# Patient Record
Sex: Male | Born: 1980 | Race: White | Marital: Single | Smoking: Never smoker
Health system: Southern US, Community
[De-identification: ages and names within clinical notes are randomized; demographics above are authoritative.]

## PROBLEM LIST (undated history)

## (undated) DIAGNOSIS — E039 Hypothyroidism, unspecified: Secondary | ICD-10-CM

## (undated) DIAGNOSIS — J45909 Unspecified asthma, uncomplicated: Secondary | ICD-10-CM

## (undated) HISTORY — DX: Hypothyroidism, unspecified: E03.9

## (undated) HISTORY — DX: Unspecified asthma, uncomplicated: J45.909

---

## 2009-05-09 ENCOUNTER — Ambulatory Visit: Admission: RE | Admit: 2009-05-09 | Payer: Self-pay | Source: Ambulatory Visit | Admitting: Plastic Surgery

## 2011-03-12 NOTE — Op Note (Unsigned)
Account Number: [id number]      Document ID: [id number]      Admit Date: 05/09/2009      Procedure Date: 05/09/2009            Patient Location: DISCHARGED 05/09/2009      Patient Type: A            SURGEON: Marybelle Killings MD      ASSISTANT:                  PREOPERATIVE DIAGNOSIS:      Excess abdominal fat.            POSTOPERATIVE DIAGNOSES:      Excess abdominal fat.            TITLE OF PROCEDURE:      Liposuction to abdomen to create 6-pack abs, on the left flank 200 mL,      right flank 200 mL, left upper abdomen 300 mL, right upper abdomen 300 mL,      left lower abdomen 200 mL, right lower abdomen 200 mL, etching on the left      150 mL, etching on the right 150 mL, total 1700 mL.            ESTIMATED BLOOD LOSS:      100 mL.            DRAINS:      None.            SPECIMENS:      None.            COMPLICATIONS:      None.            CONDITION:      Stable to recovery room in no apparent distress.            INDICATIONS:      The patient is a 30 year old white male who came to my office requesting      liposuction to create a 6-pack abs.  He was otherwise healthy, with LASIK      eye surgery in the past.  On physical exam, he had good skin tone and good      muscle tone and excess fat of his abdomen and flanks.  I discussed with him      liposuction versus 6-pack abdominal etching.  I showed him before and after      pictures and discussed with him risks and benefits of the operation.  After      all his questions were answered, the patient decided to have liposuction to      his abdomen with 6-pack abdominal etching.  He was cleared by his medical      doctor, consents were signed, photographs were taken, and patient was taken      to the operating room.            DESCRIPTION OF PROCEDURE:      In the operating room, the patient was already marked in the holding area      for liposuction to create a 6-pack abdominal etching.  The area was      injected with tumescent solution after it was prepped and draped in  normal      sterile fashion and the tumescent solution was allowed to work.      Liposuction was carried out to his abdomen and then the flanks and then      etching was performed on the  linea alba of the abdomen on the lateral      border of the rectus abdominis muscles and horizontally along the      inscriptions of the rectus abdominal muscles and the total amount removed      was 1700 mL.  For details of the breakdown, please see the brief OP note.      After the liposuction was completed, the patient had a very nice      symmetrical result with good etching contour.  The incisions were closed      with 3-0 Vicryl sutures.  Steri-Strips were placed.  Dressings were placed.       The patient was awakened, extubated.  Cotton was placed over the areas of      the etching and taped to the skin, then patient was placed in an abdominal      binder and he was awakened, extubated, taken to the recovery room in no      apparent distress.                              _______________________________     Date/Time Signed: _____________      Marybelle Killings MD ([id number])            D:  05/13/2009 17:57 PM by Dr. Marybelle Killings, MD ([id number])      T:  05/14/2009 07:24 AM by [id number]          Everlean Cherry: [id number]) (Doc ID: [id number])                  DG:LOVFIE Patrick Matousek MD

## 2011-07-12 ENCOUNTER — Ambulatory Visit (INDEPENDENT_AMBULATORY_CARE_PROVIDER_SITE_OTHER): Payer: Self-pay | Admitting: Neurology

## 2011-08-23 HISTORY — PX: BACK SURGERY: SHX140

## 2014-10-01 ENCOUNTER — Ambulatory Visit (INDEPENDENT_AMBULATORY_CARE_PROVIDER_SITE_OTHER): Payer: No Typology Code available for payment source | Admitting: Internal Medicine

## 2014-10-01 ENCOUNTER — Encounter (INDEPENDENT_AMBULATORY_CARE_PROVIDER_SITE_OTHER): Payer: Self-pay

## 2014-10-01 VITALS — BP 120/72 | HR 78 | Temp 98.3°F | Resp 18 | Ht 77.0 in | Wt 242.0 lb

## 2014-10-01 DIAGNOSIS — R319 Hematuria, unspecified: Secondary | ICD-10-CM

## 2014-10-01 DIAGNOSIS — N410 Acute prostatitis: Secondary | ICD-10-CM

## 2014-10-01 LAB — UC POCT URINALYSIS DIPSTIX (10) (MULTI-TEST)
Glucose, UA POCT: NEGATIVE mg/dL
Nitrite, UA POCT: NEGATIVE
POCT Leukocytes, UA: NEGATIVE
POCT Spec Gravity, UA: 1.035 (ref 1.001–1.035)
POCT pH, UA: 5 (ref 5–8)
Protein, UA POCT: 100 mg/dL — AB
Urobilinogen, UA: 0.2 mg/dL

## 2014-10-01 MED ORDER — CIPROFLOXACIN HCL 500 MG PO TABS
500.0000 mg | ORAL_TABLET | Freq: Two times a day (BID) | ORAL | Status: AC
Start: 2014-10-01 — End: 2014-10-15

## 2014-10-01 NOTE — Addendum Note (Signed)
Addended by: Carmon Ginsberg. on: 10/01/2014 06:05 PM     Modules accepted: Orders

## 2014-10-01 NOTE — Addendum Note (Signed)
Addended by: Nance Pear on: 10/01/2014 06:14 PM     Modules accepted: Orders

## 2014-10-01 NOTE — Progress Notes (Signed)
Subjective:       Patient ID: Patrick Freeman is a 34 y.o. male.  Chief Complaint   Patient presents with   . Hematuria     x2 days       HPI    The following portions of the patient's history were reviewed and updated as appropriate: allergies, current medications, past family history, past social history, past surgical history and problem list.  Hematuria x 2 days. No pain. No dysuria. No d/c. No abd pain. No back/flank pain. No n/v. No h/o nephrolithiasis. Father with strong h/o nephrolithiasis. No risk of stds. No h/o std in past. H/o UTI once in past.no asa/blood thinners. No new meds. No change in diet. No smoking. No supplements. No vigorous exercise. No weight loss. No f/c/ns. No urin freq/urgency/hesitancy.   Pt states he does occas have difficulty initiating urine with a weak stream - approx once q mo, not aggrevated now  Review of Systems  ROS all other negative except pertinent positives in HPI          Objective:     Physical Exam  BP 120/72 mmHg  Pulse 78  Temp(Src) 98.3 F (36.8 C) (Oral)  Resp 18  Ht 1.956 m (6\' 5" )  Wt 109.77 kg (242 lb)  BMI 28.69 kg/m2  SpO2 97%  Gen: WDWN. NAD.   HEENT: NCAT.                 No cervical adenopathy                No nuchal rigidity  CV: RRR. No m/r/g; 2+ dp  Pulm: CTAB. No w/r/r. Nl respiratory effort  Abd: soft NTND. No HSM. No CVAT  Rectal: +tender boggy prostate  Neuro: A&O  M/S: Nl ROM. No c/c/e in any extremities  Skin: warm; no rash; no pallor  Psych: mood/affect nl. Behavior nl. Judgment nl        Assessment:     hematuria  Results     Procedure Component Value Units Date/Time    UA Dipstix (10)(Multi-Test) [[id number]]  (Abnormal) Collected:  10/01/14 1703     POCT Spec Gravity, UA 1.035 Updated:  10/01/14 1714     POCT pH, UA 5.0      Glucose, UA POCT Negative mg/dL      Protein, UA POCT =409 (A) mg/dL      Ketones, UA POCT Trace (A) mg/dL      Blood, UA POCT Large (A)      POCT Leukocytes, UA Negative      Nitrite, UA POCT Negative      Bilirubin,  UA POCT Small (A)      Urobilinogen, UA 0.2 mg/dL               Plan:       Hematuria with +tender boggy prostate - will treat for prostatitis with cipro  Check cmp, cbc, complete u/a, ucx, u gc/ct/trich, psa  Referral to urology

## 2014-10-01 NOTE — Patient Instructions (Signed)
Bladder Infection,Male (Adult)    Bacterial Prostatitis  The prostate gland is part of the male reproductive system. The gland sits just below the bladder. It surrounds the urethra, the tube that carries urine and semen out of the body. Bacterial prostatitis is an infection of the prostate. It causes the prostate to become painful and swollen. The problem often comes on suddenly, and can make you very sick. But it can be treated.     Causes and symptoms  Bacterial prostatitis is due to infection with bacteria (germs). This infection makes the prostate swell. This squeezes the urethra, narrowing or blocking it. Symptoms may be severe. They can include:   Fever and chills   Low back pain   Having to urinate often   Pain with urination   A less forceful urine stream   Need to strain to urinate   Inability tourinate  Treatment  The infection is treated with antibiotic medications. Take all of the medication until it is gone, even if you start to feel better. If you don't, the infection may come back and be harder to treat. Your health care provider may also suggest other care, such as resting and drinking more fluid. Closely follow any instructions you are given.     [address] [employer] [address] [place], [place] [address]. All rights reserved. This information is not intended as a substitute for professional medical care. Always follow your healthcare professional's instructions.        You have a bladder infection.  Bacteria don't usually stay in the urine. But when they do, the urine can become infected. This is called a urinary tract infection (UTI). An infection can occur anywhere in the urinary tract. It could be in a kidney or in the bladder and urethra. The urethra is the tube that drains the urine from the bladder through the tip of the penis.  The most common place for a UTI is in the bladder. This is called a bladder infection. Most bladder infections are easily treated. They  are not serious unless the infection spreads up to the kidney.  The terms bladder infection, UTI, and cystitis are often used to describe the same thing, but they aren't always the same. Cystitis is an inflammation of the bladder. The most common cause of cystitis is an infection.  Keep in mind:   Infections in the urine are called UTIs   Cystitis is usually caused by a UTI   Not all UTIs and cases of cystitis are bladder infections   Bladder infections are the most common type of cystitis  Bladder infections are treated with antibiotics. They usually clear up quickly without complications. Treatment helps prevent a more serious kidney infection.  Symptoms of a bladder infection  The infection causes inflammation in the urethra and bladder. This inflammation causes many of the symptoms. The most common symptoms of a bladder infection are:   Pain or burning when urinating   Having to go more often than usual   Feeling like you need to goright away   Only a small amount comes out   Blood in urine   Discomfort in your belly (abdomen). This is usually in the lower abdomen, above the pubic bone.   Cloudy, strong, or bad smelling urine   Unable to urinate (retention)   Urinary incontinence   Fever   Loss of appetite  Older adults may also feel confused.  Causes of a bladder infection  Bladder infections are  not contagious. You can't get one from someone else, from a toilet seat, or from sharing a bath.  The most common cause of bladder infections is bacteria from the bowels. The bacteria get onto the skin around the opening of the urethra. From there they can get into the urine and travel up to the bladder. This causes inflammation and an infection. This usually happens because of:   An enlarged prostate   Poor cleaning of the genitals   Procedures that put a tube in your bladder, like a Foley catheter   Bowel incontinence   Older age   Not emptying your bladder. The urine stays there, giving the  bacteria a chance to grow.   Dehydration. This allows urine to stay in the bladder longer.   Constipation. This can cause the bowels to push on the bladder or urethra and keep the bladder from emptying.  Home care  Follow these guidelines when caring for yourself at home:   You have been prescribed antibiotics. Take this medicine until you have finished it, even if you feel better. Taking all of the medicine will make sure the infection has cleared.   You can use acetaminophen or ibuprofen for pain,fever, or discomfort, unless another medicine was prescribed. You can also alternate them, or use both together. They work differently and are a different class of medicines, so taking them together is not an overdose.If you have chronic liver or kidney disease, talk with your health care provider before using these medicines. Also talk with your provider if you've had a stomach ulcer or GI bleeding or are taking blood thinner medications.   You may have been given phenazopyridine to ease burning when you urinate. It will cause your urine to be bright orange. It can stain clothing.  General care:   Drink plenty of fluids, unless your health care provider told you not to. Fluids will prevent dehydration and flush out your bladder.   Use good personal hygiene. Wipe from front to back after using the toilet, and clean your penis regularly. If you aren't circumcised, retract the foreskin when cleaning.   Urinate more frequently, and don't try to hold it in for long periods of time, if possible.   Wear loose-fitting clothes and cotton underwear. Avoid tight-fitting pants. This helps keep you clean and dry.   Change your diet to prevent constipation. This means eating more fresh foods and more fiber, and less junk and fatty foods.   Avoid sex until your symptoms are gone.   Avoid caffeine, alcohol, and spicy foods. These can irritate the bladder.  Follow-up care  Follow up with your health care provider, or as  advised if all symptoms have not cleared up within 5 days. It is important to keep your follow-up appointment. You can talk with your doctor to see if you need more tests of the urinary tract.This is especially important if you have infections that keep coming back.  If a culture was done, you will be told if your treatment needs to be changed. If directed, you can callto find out the results.  If X-rays were taken, you will be told if the results will affect your treatment.  Call 911  Call 911 if any of these occur:   Trouble breathing   Difficulty waking up   Feeling confused   Fainting or loss of consciousness   Rapid heart rate  When to seek medical advice  Call your health care provider right awayif any of these   occur:   Fever of 100.54F (38C) or higher, or as directed by your health care provider   Your symptoms don't get better after 2 days of treatment   Back or abdominal pain that gets worse   Repeated vomiting, or you aren't able to keep medicine down   Weakness or dizziness   2000-2015 [employer] [address] [place] [address]. All rights reserved. This information is not intended as a substitute for professional medical care. Always follow your healthcare professional's instructions.

## 2014-10-02 LAB — CBC AND DIFFERENTIAL
Baso(Absolute): 0 10*3/uL (ref 0.0–0.2)
Basos: 0 %
Eos: 2 %
Eosinophils Absolute: 0.2 10*3/uL (ref 0.0–0.4)
Hematocrit: 44.2 % (ref 37.5–51.0)
Hemoglobin: 15 g/dL (ref 12.6–17.7)
Immature Granulocytes Absolute: 0 10*3/uL (ref 0.0–0.1)
Immature Granulocytes: 0 %
Lymphocytes Absolute: 2.9 10*3/uL (ref 0.7–3.1)
Lymphocytes: 35 %
MCH: 29 pg (ref 26.6–33.0)
MCHC: 33.9 g/dL (ref 31.5–35.7)
MCV: 85 fL (ref 79–97)
Monocytes Absolute: 0.9 10*3/uL (ref 0.1–0.9)
Monocytes: 10 %
Neutrophils Absolute: 4.4 10*3/uL (ref 1.4–7.0)
Neutrophils: 53 %
Platelets: 232 10*3/uL (ref 150–379)
RBC: 5.18 x10E6/uL (ref 4.14–5.80)
RDW: 13.7 % (ref 12.3–15.4)
WBC: 8.4 10*3/uL (ref 3.4–10.8)

## 2014-10-02 LAB — REFLEX - MICROSCOPIC EXAMINATION
Casts, UA: NONE SEEN /lpf
RBC, UA: >30 /hpf — AB (ref 0–?)

## 2014-10-02 LAB — URINALYSIS
Bilirubin, UA: NEGATIVE
Glucose, UA: NEGATIVE
Ketones UA: NEGATIVE
Leukocyte Esterase, UA: NEGATIVE
Nitrite, UA: NEGATIVE
Urine Specific Gravity: 1.029 (ref 1.005–1.030)
Urobilinogen, Ur: 0.2 mg/dL (ref 0.2–1.0)
pH, UA: 6 (ref 5.0–7.5)

## 2014-10-02 LAB — COMPREHENSIVE METABOLIC PANEL
ALT: 11 IU/L (ref 0–44)
AST (SGOT): 19 IU/L (ref 0–40)
Albumin/Globulin Ratio: 1.2 (ref 1.1–2.5)
Albumin: 4.3 g/dL (ref 3.5–5.5)
Alkaline Phosphatase: 69 IU/L (ref 39–117)
BUN / Creatinine Ratio: 14 (ref 8–19)
BUN: 15 mg/dL (ref 6–20)
Bilirubin, Total: <0.2 mg/dL (ref 0.0–1.2)
CO2: 25 mmol/L (ref 18–29)
Calcium: 9.6 mg/dL (ref 8.7–10.2)
Chloride: 102 mmol/L (ref 97–108)
Creatinine: 1.11 mg/dL (ref 0.76–1.27)
EGFR: 100 mL/min/{1.73_m2} (ref >59)
EGFR: 87 mL/min/{1.73_m2} (ref >59)
Globulin, Total: 3.7 g/dL (ref 1.5–4.5)
Glucose: 91 mg/dL (ref 65–99)
Potassium: 4.6 mmol/L (ref 3.5–5.2)
Protein, Total: 8 g/dL (ref 6.0–8.5)
Sodium: 143 mmol/L (ref 134–144)

## 2014-10-02 LAB — PSA: Prostate Specific Antigen, Total: 0.6 ng/mL (ref 0.0–4.0)

## 2014-10-02 NOTE — Progress Notes (Signed)
Quick Note:    Cbc, cmp, psa nl - ua showed blood and calcium oxalate crystals - f/u with pcp for this  ______

## 2014-10-03 ENCOUNTER — Telehealth (INDEPENDENT_AMBULATORY_CARE_PROVIDER_SITE_OTHER): Payer: Self-pay

## 2014-10-03 LAB — URINE CULTURE: Result 1:: NO GROWTH

## 2014-10-03 LAB — CHLAMYDIA/GC NAA, CONFIRMATION
CHLAMYDIA TRACHOMATIS, NAA: NEGATIVE
Neisseria gonorrhoeae, NAA: NEGATIVE

## 2014-10-03 NOTE — Progress Notes (Signed)
Quick Note:    Urine culture showed normal florae.  ______

## 2014-10-03 NOTE — Progress Notes (Signed)
Quick Note:    G/c negative.  ______

## 2014-10-04 ENCOUNTER — Telehealth (INDEPENDENT_AMBULATORY_CARE_PROVIDER_SITE_OTHER): Payer: Self-pay

## 2014-10-04 NOTE — Telephone Encounter (Signed)
-----   Message from Katherine Basset, MD sent at 10/02/2014  2:15 PM EDT -----  Cbc, cmp, psa nl - ua showed blood and calcium oxalate crystals - f/u with pcp for this

## 2014-10-08 ENCOUNTER — Encounter (INDEPENDENT_AMBULATORY_CARE_PROVIDER_SITE_OTHER): Payer: Self-pay

## 2014-10-08 NOTE — Progress Notes (Signed)
Received Authorization for Disclosure of Protected health Information from [facility] [place]. Per request, faxed lab results to [phone]. Authorization attached to chart.

## 2016-03-10 DIAGNOSIS — N200 Calculus of kidney: Secondary | ICD-10-CM | POA: Insufficient documentation

## 2016-03-10 DIAGNOSIS — J45909 Unspecified asthma, uncomplicated: Secondary | ICD-10-CM | POA: Insufficient documentation

## 2016-03-10 DIAGNOSIS — E039 Hypothyroidism, unspecified: Secondary | ICD-10-CM | POA: Insufficient documentation

## 2016-03-10 DIAGNOSIS — B001 Herpesviral vesicular dermatitis: Secondary | ICD-10-CM | POA: Insufficient documentation

## 2016-03-10 DIAGNOSIS — Z206 Contact with and (suspected) exposure to human immunodeficiency virus [HIV]: Secondary | ICD-10-CM | POA: Insufficient documentation

## 2017-01-18 ENCOUNTER — Emergency Department: Payer: BLUE CROSS/BLUE SHIELD

## 2017-01-18 ENCOUNTER — Emergency Department
Admission: EM | Admit: 2017-01-18 | Discharge: 2017-01-18 | Disposition: A | Discharge status: Home / Self Care | Payer: BLUE CROSS/BLUE SHIELD | Attending: Emergency Medicine | Admitting: Emergency Medicine

## 2017-01-18 ENCOUNTER — Telehealth: Payer: Self-pay | Admitting: Emergency Medicine

## 2017-01-18 DIAGNOSIS — R1084 Generalized abdominal pain: Secondary | ICD-10-CM | POA: Insufficient documentation

## 2017-01-18 DIAGNOSIS — R197 Diarrhea, unspecified: Secondary | ICD-10-CM | POA: Insufficient documentation

## 2017-01-18 DIAGNOSIS — R112 Nausea with vomiting, unspecified: Secondary | ICD-10-CM | POA: Diagnosis not present

## 2017-01-18 HISTORY — DX: Unspecified asthma, uncomplicated: J45.909

## 2017-01-18 LAB — COMPREHENSIVE METABOLIC PANEL
ALBUMIN: 4.4 g/dL (ref 3.5–5.0)
ALT: 18 U/L (ref 17–63)
ANION GAP: 8 (ref 5–15)
AST: 26 U/L (ref 15–41)
Alkaline Phosphatase: 56 U/L (ref 38–126)
BILIRUBIN TOTAL: 0.8 mg/dL (ref 0.3–1.2)
BUN: 11 mg/dL (ref 6–20)
CO2: 29 mmol/L (ref 22–32)
Calcium: 9.7 mg/dL (ref 8.9–10.3)
Chloride: 103 mmol/L (ref 101–111)
Creatinine, Ser: 1.09 mg/dL (ref 0.61–1.24)
GFR calc Af Amer: >60 mL/min (ref >60)
GFR calc non Af Amer: >60 mL/min (ref >60)
GLUCOSE: 104 mg/dL — AB (ref 65–99)
POTASSIUM: 4.1 mmol/L (ref 3.5–5.1)
SODIUM: 140 mmol/L (ref 135–145)
TOTAL PROTEIN: 8.2 g/dL — AB (ref 6.5–8.1)

## 2017-01-18 LAB — URINALYSIS, COMPLETE (UACMP) WITH MICROSCOPIC
Bacteria, UA: NONE SEEN
Bilirubin Urine: NEGATIVE
GLUCOSE, UA: NEGATIVE mg/dL
Ketones, ur: NEGATIVE mg/dL
Leukocytes, UA: NEGATIVE
NITRITE: NEGATIVE
PROTEIN: NEGATIVE mg/dL
SPECIFIC GRAVITY, URINE: 1.005 (ref 1.005–1.030)
pH: 6 (ref 5.0–8.0)

## 2017-01-18 LAB — CBC
HCT: 44.5 % (ref 40.0–52.0)
Hemoglobin: 15 g/dL (ref 13.0–18.0)
MCH: 29.6 pg (ref 26.0–34.0)
MCHC: 33.8 g/dL (ref 32.0–36.0)
MCV: 87.8 fL (ref 80.0–100.0)
PLATELETS: 234 10*3/uL (ref 150–440)
RBC: 5.07 MIL/uL (ref 4.40–5.90)
RDW: 13.4 % (ref 11.5–14.5)
WBC: 15.8 10*3/uL — ABNORMAL HIGH (ref 3.8–10.6)

## 2017-01-18 LAB — TROPONIN I: Troponin I: <0.03 ng/mL (ref <0.03)

## 2017-01-18 LAB — LIPASE, BLOOD: Lipase: 21 U/L (ref 11–51)

## 2017-01-18 MED ORDER — ONDANSETRON HCL 4 MG/2ML IJ SOLN
4.0000 mg | Freq: Once | INTRAMUSCULAR | Status: AC
  Administered 2017-01-18: 4 mg via INTRAVENOUS
  Filled 2017-01-18: qty 2

## 2017-01-18 MED ORDER — ONDANSETRON 4 MG PO TBDP: 4.0000 mg | ORAL_TABLET | Freq: Three times a day (TID) | ORAL | 0 refills | Status: DC | PRN

## 2017-01-18 MED ORDER — IOPAMIDOL (ISOVUE-300) INJECTION 61%
100.0000 mL | Freq: Once | INTRAVENOUS | Status: AC | PRN
  Administered 2017-01-18: 100 mL via INTRAVENOUS

## 2017-01-18 MED ORDER — SODIUM CHLORIDE 0.9 % IV BOLUS (SEPSIS)
1000.0000 mL | Freq: Once | INTRAVENOUS | Status: AC
  Administered 2017-01-18: 1000 mL via INTRAVENOUS

## 2017-01-18 MED ORDER — IOPAMIDOL (ISOVUE-300) INJECTION 61%
30.0000 mL | Freq: Once | INTRAVENOUS | Status: AC | PRN
  Administered 2017-01-18: 30 mL via ORAL

## 2017-01-18 NOTE — Discharge Instructions (Signed)
You may take Zofran needed for nausea. Eat a BRAT diet 3 days, then slowly advance diet as tolerated. You will be notified of any abnormal CT or urinalysis results. Return to the ER for worsening symptoms, persistent vomiting, difficulty breathing or other concerns.

## 2017-01-18 NOTE — Telephone Encounter (Signed)
Called patient at request of dr sung to discuss how to get ultrasound done out patient to check for cholecystitis.  Ultrasound was recommended by the radiologist in the CT scan results.  I explained to patient that emergency room doctors cannot order out patient procedures and that he should call his pcp and ask him to order this.   Patient agrees.

## 2017-01-18 NOTE — ED Notes (Signed)
CT called and pt up to toilet

## 2017-01-18 NOTE — ED Notes (Signed)
Patient transported to CT 

## 2017-01-18 NOTE — ED Provider Notes (Signed)
[facility] Emergency Department Provider Note   ____________________________________________   First MD Initiated Contact with Patient 01/18/17 [phone]     (approximate)  I have reviewed the triage vital signs and the nursing notes.   HISTORY  Chief Complaint Abdominal Pain    HPI Alexander Schwartz is a 36 y.o. male who presents to the ED from home with a chief complain of abdominal pain, nausea, vomiting, diarrhea.patient reports he returned from [place] 3 days ago, felt constipated and gave himself an enema. He had1 episode of vomiting that evening.He had good results and felt better the next day.yesterday, patient had recurrence of vomiting with numerous episodes of loose stool. Describes sharp and cramping generalized abdominal pain, maximally at epigastrium which is nonradiating ad intermittent. Denies associated fever, chills, chest pain, shortness of breath, dysuria, testicular pain or swelling. Denies recent trauma. Nothing makes his symptoms better or worse.   Past medical history None  There are no active problems to display for this patient.   No past surgical history on file.  Prior to Admission medications   Medication Sig Start Date End Date Taking? Authorizing Provider  ondansetron (ZOFRAN ODT) 4 MG disintegrating tablet Take 1 tablet (4 mg total) by mouth every 8 (eight) hours as needed for nausea or vomiting. 01/18/17   Irean Hong, MD    Allergies Patient has no known allergies.  No family history on file.  Social History Social History  Substance Use Topics  . Smoking status: Not on file  . Smokeless tobacco: Not on file  . Alcohol use Not on file    Review of Systems  Constitutional: No fever/chills. Eyes: No visual changes. ENT: No sore throat. Cardiovascular: Denies chest pain. Respiratory: Denies shortness of breath. Gastrointestinal: positive for abdominal pain, nausea, vomiting, diarrhea,  constipation. Genitourinary: Negative for dysuria. Musculoskeletal: Negative for back pain. Skin: Negative for rash. Neurological: Negative for headaches, focal weakness or numbness.   ____________________________________________   PHYSICAL EXAM:  VITAL SIGNS: ED Triage Vitals  Enc Vitals Group     BP 01/18/17 0101 (!) 136/115     Pulse Rate 01/18/17 0101 (!) 51     Resp 01/18/17 0101 20     Temp 01/18/17 0101 97.9 F (36.6 C)     Temp Source 01/18/17 0101 Oral     SpO2 01/18/17 0101 100 %     Weight 01/18/17 0101 243 lb (110.2 kg)     Height 01/18/17 0101 6\' 4"  (1.93 m)     Head Circumference --      Peak Flow --      Pain Score 01/18/17 0100 8     Pain Loc --      Pain Edu? --      Excl. in GC? --     Constitutional: Alert and oriented. Well appearing and in no acute distress. Eyes: Conjunctivae are normal. PERRL. EOMI. Head: Atraumatic. Nose: No congestion/rhinnorhea. Mouth/Throat: Mucous membranes are mildly dry.  Oropharynx non-erythematous. Neck: No stridor.   Cardiovascular: Normal rate, regular rhythm. Grossly normal heart sounds.  Good peripheral circulation. Respiratory: Normal respiratory effort.  No retractions. Lungs CTAB. Gastrointestinal: Soft and minimally tender to palpation diffusely without rebound or guarding. No distention. No abdominal bruits. No CVA tenderness. Musculoskeletal: No lower extremity tenderness nor edema.  No joint effusions. Neurologic:  Normal speech and language. No gross focal neurologic deficits are appreciated. No gait instability. Skin:  Skin is warm, dry and intact. No rash noted. Psychiatric:  Mood and affect are normal. Speech and behavior are normal.  ____________________________________________   LABS (all labs ordered are listed, but only abnormal results are displayed)  Labs Reviewed  CBC - Abnormal; Notable for the following:       Result Value   WBC 15.8 (*)    All other components within normal limits   COMPREHENSIVE METABOLIC PANEL - Abnormal; Notable for the following:    Glucose, Bld 104 (*)    Total Protein 8.2 (*)    All other components within normal limits  URINALYSIS, COMPLETE (UACMP) WITH MICROSCOPIC - Abnormal; Notable for the following:    Color, Urine STRAW (*)    APPearance CLEAR (*)    Hgb urine dipstick SMALL (*)    Squamous Epithelial / LPF 0-5 (*)    All other components within normal limits  TROPONIN I  LIPASE, BLOOD   ____________________________________________  EKG  ED ECG REPORT I, Gaige Sebo J, the attending physician, personally viewed and interpreted this ECG.   Date: 01/18/2017  EKG Time: 0101  Rate: 48  Rhythm: junctional  Axis: normal  Intervals:none  ST&T Change: nonspecific  ____________________________________________  RADIOLOGY  Ct Abdomen Pelvis W Contrast  Result Date: 01/18/2017 CLINICAL DATA:  Generalized abdominal pain with nausea and vomiting for 3 days. EXAM: CT ABDOMEN AND PELVIS WITH CONTRAST TECHNIQUE: Multidetector CT imaging of the abdomen and pelvis was performed using the standard protocol following bolus administration of intravenous contrast. CONTRAST:  ISOVUE-300 IOPAMIDOL (ISOVUE-300) INJECTION 61% COMPARISON:  None. FINDINGS: Lower chest: Atelectatic appearing lung base opacities bilaterally, without confluent consolidation. No pleural effusion. Hepatobiliary: No focal liver lesions. No bile duct dilatation. There is mild gallbladder mural thickening and pericholecystic fluid, raising the question of cholecystitis. Pancreas: Unremarkable. No pancreatic ductal dilatation or surrounding inflammatory changes. Spleen: Normal in size without focal abnormality. Adrenals/Urinary Tract: Both adrenals are normal. 1.7 cm upper pole left renal cyst. No significant renal parenchymal lesions. Collecting systems, ureters and urinary bladder are unremarkable. Stomach/Bowel: Stomach is within normal limits. Appendix is normal. No evidence  of bowel wall thickening, distention, or inflammatory changes. Vascular/Lymphatic: No significant vascular findings are present. No enlarged abdominal or pelvic lymph nodes. Reproductive: Unremarkable Other: No ascites. Musculoskeletal: No significant skeletal lesion. IMPRESSION: Mild gallbladder mural thickening and pericholecystic fluid ; cannot exclude cholecystitis. Consider sonography. Electronically Signed   By: Ellery Plunk M.D.   On: 01/18/2017 05:59    ____________________________________________   PROCEDURES  Procedure(s) performed: None  Procedures  Critical Care performed: No  ____________________________________________   INITIAL IMPRESSION / ASSESSMENT AND PLAN / ED COURSE  Pertinent labs & imaging results that were available during my care of the patient were reviewed by me and considered in my medical decision making (see chart for details).  36 year old male who presents with generalized abdominal pain, nausea/vomiting/diarrhea after giving himself an enema. States he is here specifically for a CT scan. Laboratory results remarkable for moderate leukocytosis. I think CT imaging is reasonable given patient's clinical symptoms. Will initiate IV fluid resuscitation and proceed with CT abdomen/pelvis.  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 Was able to convince him to stay for completion of his CT scan. Will notify him of any abnormal CT or urinalysis results. Strict return precautions given. Patient verbalizes understanding and agrees with plan of care.  [JS]  [id number] Patient called back for results. Updated him of urinalysis and  CT results recommending abdominal ultrasound. Patient prefers to do this on an outpatient basis. Nurse to speak with ultrasound department to see how we can schedule this for the patient.  [JS]  [id number] Scheduling department does not open until 7:30 AM. Will ask our nurse  navigator to facilitate scheduling patient's outpatient abdominal ultrasound.  [JS]    Clinical Course User Index [JS] Irean Hong, MD     ____________________________________________   FINAL CLINICAL IMPRESSION(S) / ED DIAGNOSES  Final diagnoses:  Generalized abdominal pain  Nausea vomiting and diarrhea      NEW MEDICATIONS STARTED DURING THIS VISIT:  Discharge Medication List as of 01/18/2017  5:39 AM    START taking these medications   Details  ondansetron (ZOFRAN ODT) 4 MG disintegrating tablet Take 1 tablet (4 mg total) by mouth every 8 (eight) hours as needed for nausea or vomiting., Starting Tue 01/18/2017, Print         Note:  This document was prepared using Dragon voice recognition software and may include unintentional dictation errors.    Irean Hong, MD 01/18/17 [phone]

## 2017-01-21 ENCOUNTER — Other Ambulatory Visit: Payer: Self-pay | Admitting: Internal Medicine

## 2017-01-21 ENCOUNTER — Ambulatory Visit
Admission: RE | Admit: 2017-01-21 | Discharge: 2017-01-21 | Disposition: A | Discharge status: Home / Self Care | Payer: BLUE CROSS/BLUE SHIELD | Source: Ambulatory Visit | Attending: Internal Medicine | Admitting: Internal Medicine

## 2017-01-21 DIAGNOSIS — R1013 Epigastric pain: Secondary | ICD-10-CM | POA: Diagnosis not present

## 2017-01-21 DIAGNOSIS — N281 Cyst of kidney, acquired: Secondary | ICD-10-CM | POA: Insufficient documentation

## 2017-01-21 DIAGNOSIS — K829 Disease of gallbladder, unspecified: Secondary | ICD-10-CM | POA: Diagnosis not present

## 2017-03-17 ENCOUNTER — Emergency Department
Admission: EM | Admit: 2017-03-17 | Discharge: 2017-03-17 | Disposition: A | Discharge status: Home / Self Care | Payer: BLUE CROSS/BLUE SHIELD | Attending: Emergency Medicine | Admitting: Emergency Medicine

## 2017-03-17 ENCOUNTER — Emergency Department: Payer: BLUE CROSS/BLUE SHIELD

## 2017-03-17 ENCOUNTER — Encounter: Payer: Self-pay | Admitting: Emergency Medicine

## 2017-03-17 DIAGNOSIS — Z79899 Other long term (current) drug therapy: Secondary | ICD-10-CM | POA: Diagnosis not present

## 2017-03-17 DIAGNOSIS — R1013 Epigastric pain: Secondary | ICD-10-CM | POA: Diagnosis present

## 2017-03-17 DIAGNOSIS — R197 Diarrhea, unspecified: Secondary | ICD-10-CM | POA: Insufficient documentation

## 2017-03-17 DIAGNOSIS — R001 Bradycardia, unspecified: Secondary | ICD-10-CM | POA: Diagnosis not present

## 2017-03-17 DIAGNOSIS — R112 Nausea with vomiting, unspecified: Secondary | ICD-10-CM | POA: Diagnosis not present

## 2017-03-17 DIAGNOSIS — J45909 Unspecified asthma, uncomplicated: Secondary | ICD-10-CM | POA: Insufficient documentation

## 2017-03-17 LAB — COMPREHENSIVE METABOLIC PANEL
ALT: 22 U/L (ref 17–63)
AST: 32 U/L (ref 15–41)
Albumin: 4.1 g/dL (ref 3.5–5.0)
Alkaline Phosphatase: 61 U/L (ref 38–126)
Anion gap: 9 (ref 5–15)
BILIRUBIN TOTAL: 0.6 mg/dL (ref 0.3–1.2)
BUN: 14 mg/dL (ref 6–20)
CALCIUM: 9.2 mg/dL (ref 8.9–10.3)
CHLORIDE: 104 mmol/L (ref 101–111)
CO2: 27 mmol/L (ref 22–32)
Creatinine, Ser: 1.16 mg/dL (ref 0.61–1.24)
GLUCOSE: 159 mg/dL — AB (ref 65–99)
Potassium: 3.8 mmol/L (ref 3.5–5.1)
Sodium: 140 mmol/L (ref 135–145)
TOTAL PROTEIN: 8.3 g/dL — AB (ref 6.5–8.1)

## 2017-03-17 LAB — URINALYSIS, COMPLETE (UACMP) WITH MICROSCOPIC
Bacteria, UA: NONE SEEN
Bilirubin Urine: NEGATIVE
Glucose, UA: 50 mg/dL — AB
HGB URINE DIPSTICK: NEGATIVE
Ketones, ur: 5 mg/dL — AB
Leukocytes, UA: NEGATIVE
Nitrite: NEGATIVE
Protein, ur: NEGATIVE mg/dL
SPECIFIC GRAVITY, URINE: 1.033 — AB (ref 1.005–1.030)
SQUAMOUS EPITHELIAL / LPF: NONE SEEN
pH: 6 (ref 5.0–8.0)

## 2017-03-17 LAB — TROPONIN I: Troponin I: <0.03 ng/mL (ref <0.03)

## 2017-03-17 LAB — CBC
HEMATOCRIT: 42.7 % (ref 40.0–52.0)
Hemoglobin: 14.6 g/dL (ref 13.0–18.0)
MCH: 29.9 pg (ref 26.0–34.0)
MCHC: 34.2 g/dL (ref 32.0–36.0)
MCV: 87.3 fL (ref 80.0–100.0)
PLATELETS: 201 10*3/uL (ref 150–440)
RBC: 4.89 MIL/uL (ref 4.40–5.90)
RDW: 13.3 % (ref 11.5–14.5)
WBC: 10.7 10*3/uL — ABNORMAL HIGH (ref 3.8–10.6)

## 2017-03-17 LAB — LIPASE, BLOOD: Lipase: 24 U/L (ref 11–51)

## 2017-03-17 MED ORDER — FENTANYL CITRATE (PF) 100 MCG/2ML IJ SOLN
25.0000 ug | Freq: Once | INTRAMUSCULAR | Status: AC
  Administered 2017-03-17: 25 ug via INTRAVENOUS

## 2017-03-17 MED ORDER — FENTANYL CITRATE (PF) 100 MCG/2ML IJ SOLN
50.0000 ug | Freq: Once | INTRAMUSCULAR | Status: AC
  Administered 2017-03-17: 50 ug via INTRAVENOUS

## 2017-03-17 MED ORDER — ONDANSETRON HCL 4 MG/2ML IJ SOLN
INTRAMUSCULAR | Status: AC
  Filled 2017-03-17: qty 2

## 2017-03-17 MED ORDER — DICYCLOMINE HCL 10 MG PO CAPS
10.0000 mg | ORAL_CAPSULE | Freq: Once | ORAL | Status: AC
  Administered 2017-03-17: 10 mg via ORAL
  Filled 2017-03-17: qty 1

## 2017-03-17 MED ORDER — ATROPINE SULFATE 1 MG/10ML IJ SOSY
PREFILLED_SYRINGE | INTRAMUSCULAR | Status: AC
  Filled 2017-03-17: qty 10

## 2017-03-17 MED ORDER — SODIUM CHLORIDE 0.9 % IV BOLUS (SEPSIS)
1000.0000 mL | Freq: Once | INTRAVENOUS | Status: AC
  Administered 2017-03-17: 1000 mL via INTRAVENOUS

## 2017-03-17 MED ORDER — ONDANSETRON 4 MG PO TBDP: 4.0000 mg | ORAL_TABLET | Freq: Three times a day (TID) | ORAL | 0 refills | Status: DC | PRN

## 2017-03-17 MED ORDER — FENTANYL CITRATE (PF) 100 MCG/2ML IJ SOLN
INTRAMUSCULAR | Status: AC
  Administered 2017-03-17: 25 ug via INTRAVENOUS
  Filled 2017-03-17: qty 2

## 2017-03-17 MED ORDER — ONDANSETRON HCL 4 MG/2ML IJ SOLN
4.0000 mg | Freq: Once | INTRAMUSCULAR | Status: AC
  Administered 2017-03-17: 4 mg via INTRAVENOUS

## 2017-03-17 MED ORDER — DICYCLOMINE HCL 20 MG PO TABS: 20.0000 mg | ORAL_TABLET | Freq: Three times a day (TID) | ORAL | 0 refills | Status: AC | PRN

## 2017-03-17 MED ORDER — IOPAMIDOL (ISOVUE-300) INJECTION 61%
100.0000 mL | Freq: Once | INTRAVENOUS | Status: AC | PRN
  Administered 2017-03-17: 100 mL via INTRAVENOUS

## 2017-03-17 NOTE — ED Provider Notes (Signed)
[facility] Emergency Department Provider Note  ____________________________________________   First MD Initiated Contact with Patient 03/17/17 [phone]     (approximate)  I have reviewed the triage vital signs and the nursing notes.   HISTORY  Chief Complaint Abdominal Pain    HPI Alexander Schwartz is a 36 y.o. male presents with acute onset of epigastric abdominal pain which began at 9 PM last night accompanied by nausea vomiting and diarrhea. Patient denies any fever afebrile on presentation. Patient states last by mouth ingestion was approximately 8:00 yesterday evening. Patient denies any urinary symptoms. Current pain score 9 out of 10.   Past Medical History:  Diagnosis Date  . Asthma     There are no active problems to display for this patient.   History reviewed. No pertinent surgical history.  Prior to Admission medications   Medication Sig Start Date End Date Taking? Authorizing Provider  ondansetron (ZOFRAN ODT) 4 MG disintegrating tablet Take 1 tablet (4 mg total) by mouth every 8 (eight) hours as needed for nausea or vomiting. 01/18/17   Irean Hong, MD    Allergies no known drug allergies No family history on file.  Social History Social History  Substance Use Topics  . Smoking status: Never Smoker  . Smokeless tobacco: Never Used  . Alcohol use No    Review of Systems Constitutional: No fever/chills Eyes: No visual changes. ENT: No sore throat. Cardiovascular: Denies chest pain. Respiratory: Denies shortness of breath. Gastrointestinal: positive for abdominal pain nausea vomiting and diarrhea Genitourinary: Negative for dysuria. Musculoskeletal: Negative for neck pain.  Negative for back pain. Integumentary: Negative for rash. Neurological: Negative for headaches, focal weakness or numbness.  ____________________________________________   PHYSICAL EXAM:  VITAL SIGNS: ED Triage Vitals  Enc Vitals Group     BP  03/17/17 0454 (!) 127/93     Pulse Rate 03/17/17 0454 (!) 38     Resp 03/17/17 0454 18     Temp 03/17/17 0454 98.9 F (37.2 C)     Temp Source 03/17/17 0454 Oral     SpO2 03/17/17 0454 100 %     Weight 03/17/17 0450 108.9 kg (240 lb)     Height 03/17/17 0450 1.956 m (6\' 5" )     Head Circumference --      Peak Flow --      Pain Score 03/17/17 0449 9     Pain Loc --      Pain Edu? --      Excl. in GC? --     Constitutional: Alert and oriented. Well appearing and in no acute distress. Eyes: Conjunctivae are normal.  Head: Atraumatic. Mouth/Throat: Mucous membranes are moist.  Oropharynx non-erythematous. Neck: No stridor.   Cardiovascular: Normal rate, regular rhythm. Good peripheral circulation. Grossly normal heart sounds. Respiratory: Normal respiratory effort.  No retractions. Lungs CTAB. Gastrointestinal: Soft and nontender. No distention.  Musculoskeletal: No lower extremity tenderness nor edema. No gross deformities of extremities. Neurologic:  Normal speech and language. No gross focal neurologic deficits are appreciated.  Skin:  Skin is warm, dry and intact. No rash noted. Psychiatric: Mood and affect are normal. Speech and behavior are normal.  ____________________________________________   LABS (all labs ordered are listed, but only abnormal results are displayed)  Labs Reviewed  CBC - Abnormal; Notable for the following:       Result Value   WBC 10.7 (*)    All other components within normal limits  COMPREHENSIVE METABOLIC PANEL -  Abnormal; Notable for the following:    Glucose, Bld 159 (*)    Total Protein 8.3 (*)    All other components within normal limits  URINALYSIS, COMPLETE (UACMP) WITH MICROSCOPIC - Abnormal; Notable for the following:    Color, Urine YELLOW (*)    APPearance CLEAR (*)    Specific Gravity, Urine 1.033 (*)    Glucose, UA 50 (*)    Ketones, ur 5 (*)    All other components within normal limits  GASTROINTESTINAL PANEL BY PCR, STOOL  (REPLACES STOOL CULTURE)  TROPONIN I  LIPASE, BLOOD   ____________________________________________  EKG  ED ECG REPORT I, Waldron N Brieann Osinski, the attending physician, personally viewed and interpreted this ECG.   Date: 03/17/2017  EKG Time: 4:59 AM  Rate: 40  Rhythm: sinus bradycardia  Axis: normal  Intervals:normal  ST&T Change:   ____________________________________________  RADIOLOGY I, [place] Dewayne Shorter, personally viewed and evaluated these images (plain radiographs) as part of my medical decision making, as well as reviewing the written report by the radiologist.  Ct Abdomen Pelvis W Contrast  Result Date: 03/17/2017 CLINICAL DATA:  Acute onset of mid abdominal pain. Nausea, vomiting and diarrhea. Initial encounter. EXAM: CT ABDOMEN AND PELVIS WITH CONTRAST TECHNIQUE: Multidetector CT imaging of the abdomen and pelvis was performed using the standard protocol following bolus administration of intravenous contrast. CONTRAST:  ISOVUE-300 IOPAMIDOL (ISOVUE-300) INJECTION 61% COMPARISON:  CT of the abdomen and pelvis from 01/18/2017, and abdominal ultrasound performed 01/21/2017 FINDINGS: Lower chest: Mild bibasilar atelectasis is noted. The visualized portions of the mediastinum are unremarkable. Hepatobiliary: The liver is unremarkable in appearance. The gallbladder is unremarkable in appearance. The common bile duct remains normal in caliber. Pancreas: The pancreas is within normal limits. Spleen: The spleen is unremarkable in appearance. Adrenals/Urinary Tract: The adrenal glands are unremarkable in appearance. A small left renal cyst is noted. The kidneys are otherwise unremarkable. There is no evidence of hydronephrosis. No renal or ureteral stones are identified. Stomach/Bowel: The stomach is unremarkable in appearance. The small bowel is within normal limits. The appendix is normal in caliber, without evidence of appendicitis. The colon is unremarkable in appearance.  Vascular/Lymphatic: The abdominal aorta is unremarkable in appearance. The inferior vena cava is grossly unremarkable. No retroperitoneal lymphadenopathy is seen. No pelvic sidewall lymphadenopathy is identified. Reproductive: The bladder is mildly distended and grossly unremarkable. The prostate remains normal in size. Other: Mild nonspecific haziness is noted about the celiac trunk, of uncertain significance. Musculoskeletal: No acute osseous abnormalities are identified. Vacuum phenomenon and endplate sclerotic change are noted at L5-S1. The visualized musculature is unremarkable in appearance. IMPRESSION: 1. Mild nonspecific haziness noted about the celiac trunk, perhaps slightly more prominent than on the prior study, of uncertain significance. 2. Small left renal cyst. 3. Mild bibasilar atelectasis noted. Electronically Signed   By: Roanna Raider M.D.   On: 03/17/2017 05:41     Procedures   ____________________________________________   INITIAL IMPRESSION / ASSESSMENT AND PLAN / ED COURSE  As part of my medical decision making, I reviewed the following data within the electronic MEDICAL RECORD NUMBER rate 40-year-old male presenting with above stated history of abdominal discomfort with nausea and vomiting and diarrhea. patient noted to be markedly bradycardic on arrival with heart rate drifting as low as 38however patient remained normotensive with a systolic blood pressure and 27/93.Marland Kitchen Patient given Fentanyl with improvement of pain. CT scan of the abdomen and pelvis revealed no acute intra-abdominal pathology. I reviewed the patient's  previous chart which revealed visit in August for similar complaint with negative CT scan obtained at that time as well. Patient's heart rate and August was noted to be 51.I spoke with the patient regarding all clinical findings. We'll obtain a stool sample to evaluate for infectious etiology of the patient's vomiting and diarrhea. if the patient becomes symptomatic  bradycardia would admit the patient to the hospital. Patient discussed with Dr Langston MaskerShaevitz in signout.     ____________________________________________  FINAL CLINICAL IMPRESSION(S) / ED DIAGNOSES  Final diagnoses:  Nausea vomiting and diarrhea  Epigastric pain  Bradycardia     MEDICATIONS GIVEN DURING THIS VISIT:  Medications  atropine 1 MG/10ML injection (not administered)  sodium chloride 0.9 % bolus 1,000 mL (0 mLs Intravenous Stopped 03/17/17 0540)  sodium chloride 0.9 % bolus 1,000 mL (0 mLs Intravenous Stopped 03/17/17 0540)  ondansetron (ZOFRAN) injection 4 mg (4 mg Intravenous Given 03/17/17 0510)  fentaNYL (SUBLIMAZE) injection 25 mcg (25 mcg Intravenous Given 03/17/17 0511)  iopamidol (ISOVUE-300) 61 % injection 100 mL (100 mLs Intravenous Contrast Given 03/17/17 0521)  fentaNYL (SUBLIMAZE) injection 25 mcg (25 mcg Intravenous Given 03/17/17 0618)  fentaNYL (SUBLIMAZE) injection 50 mcg (50 mcg Intravenous Given 03/17/17 0622)     NEW OUTPATIENT MEDICATIONS STARTED DURING THIS VISIT:  New Prescriptions   No medications on file    Modified Medications   No medications on file    Discontinued Medications   No medications on file     Note:  This document was prepared using Dragon voice recognition software and may include unintentional dictation errors.    Darci CurrentBrown, Bow[place] N, MD 03/17/17 [phone]

## 2017-03-17 NOTE — ED Triage Notes (Signed)
Patient ambulatory to triage with steady gait, without difficulty or distress noted; pt reports mid abd pain since 9pm accomp by N/V/D

## 2017-03-17 NOTE — ED Notes (Signed)

## 2017-03-17 NOTE — ED Notes (Signed)
Upon taking vs, noted pt with slow regular pulse; pt placed on card monitor for EKG; pt denies any hx of bradycardia; Dr Manson PasseyBrown to bedside

## 2017-03-17 NOTE — ED Provider Notes (Signed)
Signout from Dr. Manson PasseyBrown in this 36 year old male with recurrent nausea vomiting and diarrhea. Pending stool studies. Also patient is bradycardic. We'll continue to monitor. However, patient appeared to be bradycardic during his last ER visit.  Physical Exam  BP 125/79   Pulse (!) 41 Comment: MD aware of bradycardia  Temp 98.9 F (37.2 C) (Oral)   Resp 14   Ht 6\' 5"  (1.956 m)   Wt 108.9 kg (240 lb)   SpO2 100%   BMI 28.46 kg/m  ----------------------------------------- 8:54 AM on 03/17/2017 -----------------------------------------   Physical Exam Patient without any distress at this time. ED Course  Procedures  MDM Patient without further episodes of nausea and vomiting. Patient not reporting any pain at this time. Not reporting any predictable symptoms. Patient not able to move his bowels. He says that he would rather follow up with GI and cardiology as an outpatient. Patient will be discharged at this time.       Myrna BlazerSchaevitz, Larraine Argo Matthew, MD 03/17/17 [phone]

## 2017-04-01 ENCOUNTER — Other Ambulatory Visit: Payer: Self-pay

## 2017-04-01 ENCOUNTER — Ambulatory Visit: Payer: BLUE CROSS/BLUE SHIELD | Admitting: Gastroenterology

## 2017-04-01 ENCOUNTER — Encounter: Payer: Self-pay | Admitting: Gastroenterology

## 2017-04-01 VITALS — BP 107/69 | HR 82 | Temp 98.1°F | Ht 77.0 in | Wt 245.4 lb

## 2017-04-01 DIAGNOSIS — R197 Diarrhea, unspecified: Secondary | ICD-10-CM

## 2017-04-01 DIAGNOSIS — R1013 Epigastric pain: Secondary | ICD-10-CM | POA: Diagnosis not present

## 2017-04-01 MED ORDER — OMEPRAZOLE 40 MG PO CPDR: 40.0000 mg | DELAYED_RELEASE_CAPSULE | Freq: Every day | ORAL | 3 refills | Status: DC

## 2017-04-01 NOTE — Progress Notes (Signed)
Melodie BouillonVarnita Tallulah Hosman, MD [address], [address], [place], [place], [id number] [address], [place], [place], [address] Phone: [phone]  Fax: [phone]  Consultation  Referring Provider:     [facility] Dr. Judithann SheenSparks Primary Care Physician:  [facility] Primary Gastroenterologist:  Pasty SpillersVarnita B Jaydan Meidinger, MD        Reason for Consultation:     Epigastric abdominal pain  Date of Consultation:  04/01/2017         HPI:   Alexander Schwartz is a 36 y.o. male with 2 episodes of midepigastric abdominal pain once in August 2018 and another one October 2018 that led him to the ER.  During the ER visit CT scan did not reveal an etiology of his abdominal pain.  Abdominal ultrasound was also done which showed a mildly thickened gallbladder wall measuring 0.4 cm, no evidence of gallstones or sludge.  CBD was normal at 0.3 cm.  On review of his CT scan images from October 2018 there is a lot of stool that I noted in the colon.  Patient has had no weight loss.  He states once this pain starts it lasts 2-3 weeks.  No melena or hematochezia.  He states both times that the pain started he was traveling.  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  Has never been on a PPI or antireflux medications.  He was given dicyclomine and Zofran in the ER which do not help.  He is on Truvada but states that is only for ?prophylaxis.  His HIV testing in care everywhere under his last annual exam, PCP note was negative.  No family history of colon cancer.  No previous history of endoscopies.  Past Medical History:  Diagnosis Date  . Asthma     History reviewed. No pertinent surgical history.  Prior to Admission medications   Medication Sig Start Date End Date Taking? Authorizing Provider  albuterol (PROAIR HFA) 108 (90 Base) MCG/ACT inhaler  01/27/16  Yes [provider]  emtricitabine-tenofovir (TRUVADA) 200-300 MG tablet Take by  mouth. 03/16/17  Yes [provider]  finasteride (PROSCAR) 5 MG tablet TK 1 T PO  ONCE D 02/22/17  Yes [provider]  fluocinonide cream (LIDEX) 0.05 % once daily as needed. 10/20/13  Yes [provider]  fluticasone (FLONASE) 50 MCG/ACT nasal spray PLACE 2 SPRAYS IN EACH NOSTRIL ONCE DAILY 02/20/17  Yes [provider]  Fluticasone-Salmeterol (ADVAIR DISKUS) 250-50 MCG/DOSE AEPB USE 1 INHALATION BY MOUTH INTO THE LUNGS EVERY 12 HOURS 02/20/17  Yes [provider]  montelukast (SINGULAIR) 10 MG tablet Take by mouth. 03/16/17  Yes [provider]  tretinoin (RETIN-A) 0.1 % cream Apply topically.   Yes [provider]  valACYclovir (VALTREX) 1000 MG tablet TK 1 T PO ONCE D GEF VALTREX 02/07/17  Yes [provider]  Ciclesonide (ZETONNA) 37 MCG/ACT AERS Place into the nose.    [provider]  dicyclomine (BENTYL) 20 MG tablet Take 1 tablet (20 mg total) by mouth 3 (three) times daily as needed for spasms. 03/17/17 03/27/17  Darci CurrentBrown, [place] N, MD  levothyroxine (SYNTHROID, LEVOTHROID) 25 MCG tablet Take by mouth.    [provider]  omeprazole (PRILOSEC) 40 MG capsule Take 1 capsule (40 mg total) daily by mouth. 04/01/17   Pasty Spillersahiliani, Tijah Hane B, MD  ondansetron (ZOFRAN ODT) 4 MG disintegrating tablet Take 1 tablet (4 mg total) by mouth  every 8 (eight) hours as needed for nausea or vomiting. Patient not taking: Reported on 04/01/2017 01/18/17   Irean HongSung, Jade J, MD  ondansetron (ZOFRAN ODT) 4 MG disintegrating tablet Take 1 tablet (4 mg total) by mouth every 8 (eight) hours as needed for nausea or vomiting. Patient not taking: Reported on 04/01/2017 03/17/17   Darci CurrentBrown, [place] N, MD    Family History  Problem Relation Age of Onset  . Breast cancer Mother   . Prostate cancer Father   . Colon cancer Maternal Grandmother      Social History   Tobacco Use  . Smoking status: Never Smoker  . Smokeless tobacco: Never Used    Substance Use Topics  . Alcohol use: No  . Drug use: No    Allergies as of 04/01/2017  . (No Known Allergies)    Review of Systems:    All systems reviewed and negative except where noted in HPI.   Physical Exam:  Vital signs in last 24 hours: @VSRANGES @   General:   Pleasant, cooperative in NAD Head:  Normocephalic and atraumatic. Eyes:   No icterus.   Conjunctiva pink. PERRLA. Ears:  Normal auditory acuity. Neck:  Supple; no masses or thyroidomegaly Lungs: Respirations even and unlabored. Lungs clear to auscultation bilaterally.   No wheezes, crackles, or rhonchi.  Heart:  Regular rate and rhythm;  Without murmur, clicks, rubs or gallops Abdomen:  Soft, nondistended, mildly tender to palpation b/l upper quadrant. Normal bowel sounds. No appreciable masses or hepatomegaly.  No rebound or guarding.  Neurologic:  Alert and oriented x3;  grossly normal neurologically. Skin:  Intact without significant lesions or rashes. Cervical Nodes:  No significant cervical adenopathy. Psych:  Alert and cooperative. Normal affect.  LAB RESULTS: No results for input(s): WBC, HGB, HCT, PLT in the last 72 hours. BMET No results for input(s): NA, K, CL, CO2, GLUCOSE, BUN, CREATININE, CALCIUM in the last 72 hours. LFT No results for input(s): PROT, ALBUMIN, AST, ALT, ALKPHOS, BILITOT, BILIDIR, IBILI in the last 72 hours. PT/INR No results for input(s): LABPROT, INR in the last 72 hours. Labs from October 2018 when he visited the ER reviewed and shows a normal CMP And normal hemoglobin. STUDIES: No results found. CT and abdominal ultrasound report reviewed.  CT images reviewed specifically in relation to his colon which showed a large stool burden.    Impression / Plan:   Alexander Schwartz is a 36 y.o. y/o male with midepigastric abdominal pain x2, once in August and once in October with unrevealing CT scan and abdominal ultrasound with normal liver enzymes and hemoglobin and no alarm  symptoms  Patient reports symptoms of acid reflux and has never been on anti-reflux medications Will order stool for H. pylori given his dyspepsia Pain is not consistent with biliary colic and does not occur in relation to meals. Is worse at night indicating relation to acid reflux. No alarm symptoms at this time to indicate further interventions Patient was asked to provide stool sample before starting Prilosec daily.  Educated on antireflux measures Avoid NSAIDs Patient was asked to start MiraLAX daily and educated on titrating it to goal of 1-2 soft problems every day Patient was asked to contact us if these measures do not improve his symptoms  Thank you for involving me in the care of this patient.     Pasty SpillersVarnita B Ineta Sinning, MD  04/01/2017, 9:13 AM

## 2017-04-01 NOTE — Patient Instructions (Signed)
Please start Prilosec daily once you do the H pylori stool lab.  Please use over the counter Miralax as directed.

## 2017-04-12 LAB — H. PYLORI ANTIGEN, STOOL: H pylori Ag, Stl: NEGATIVE

## 2017-04-18 ENCOUNTER — Telehealth: Payer: Self-pay

## 2017-04-18 NOTE — Telephone Encounter (Signed)
Advised patient of results per Dr. Maximino Greenlandahiliani:  - H. Pylori is negative.

## 2017-07-12 ENCOUNTER — Ambulatory Visit: Payer: Self-pay | Admitting: Medical

## 2017-07-12 ENCOUNTER — Encounter: Payer: Self-pay | Admitting: Medical

## 2017-07-12 VITALS — BP 124/86 | HR 119 | Temp 99.0°F | Resp 18 | Ht 77.0 in | Wt 255.2 lb

## 2017-07-12 DIAGNOSIS — Z114 Encounter for screening for human immunodeficiency virus [HIV]: Secondary | ICD-10-CM

## 2017-07-12 DIAGNOSIS — J029 Acute pharyngitis, unspecified: Secondary | ICD-10-CM

## 2017-07-12 MED ORDER — AMOXICILLIN-POT CLAVULANATE 875-125 MG PO TABS: 1.0000 | ORAL_TABLET | Freq: Two times a day (BID) | ORAL | 0 refills | Status: AC

## 2017-07-12 NOTE — Progress Notes (Signed)
   Subjective:    Patient ID: Alexander BusmanJoseph Clayton Schwartz, male    DOB: [birth year], 37 y.o.   MRN: [id number]  HPI 37 yo in non acute distress. Sore throat since Sunday. Night sweat last night , muscle aches. No runny nose.  Upper and lower back aches. Some in the shoulders nothing in the legs..   Review of Systems  Constitutional: Positive for chills, diaphoresis (last night) and fatigue (little bit).  HENT: Positive for sore throat and trouble swallowing. Negative for congestion, ear discharge and voice change.   Respiratory: Positive for cough (mild). Negative for shortness of breath.   Cardiovascular: Negative for chest pain.  Gastrointestinal: Positive for diarrhea. Negative for nausea and vomiting.  Genitourinary: Negative for dysuria.  Musculoskeletal: Positive for back pain and myalgias.  Skin: Positive for rash (eczema).  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  Hematological: Positive for adenopathy.  Psychiatric/Behavioral: Negative for behavioral problems, self-injury, sleep disturbance and suicidal ideas. The patient is not nervous/anxious.    Slept more on Sunday and on Monday.    Objective:   Physical Exam  Constitutional: He is oriented to person, place, and time. He appears well-developed and well-nourished.  HENT:  Head: Normocephalic and atraumatic.  Right Ear: Hearing, external ear and ear canal normal. A middle ear effusion is present.  Left Ear: Hearing, external ear and ear canal normal. A middle ear effusion is present.  Mouth/Throat: Uvula is midline and mucous membranes are normal. Posterior oropharyngeal edema and posterior oropharyngeal erythema present.  Eyes: Conjunctivae and EOM are normal. Pupils are equal, round, and reactive to light.  Neck: Normal range of motion. Neck supple.  Cardiovascular: Normal rate, regular rhythm and normal heart sounds.  Pulmonary/Chest:  Effort normal and breath sounds normal.  Lymphadenopathy:    He has cervical adenopathy (left side).  Neurological: He is alert and oriented to person, place, and time.  Skin: Skin is warm and dry.  Psychiatric: He has a normal mood and affect. His behavior is normal. Judgment and thought content normal.  Nursing note and vitals reviewed.   1+ tonsil on the right 2+ tonsil on the left.      Assessment & Plan:  Pharyngitis  Possible Left tonsillar abcess will recheck in 24 hours. Patient asks for  HIV test " I just want to be tested". Will also check CBC w/ diff.  Appointment made for  3 pm tomorrow afternoon. Patient verbalizes understanding and has no questions at discharge.

## 2017-07-12 NOTE — Patient Instructions (Signed)
Pharyngitis Pharyngitis is a sore throat (pharynx). There is redness, pain, and swelling of your throat. Follow these instructions at home:  Drink enough fluids to keep your pee (urine) clear or pale yellow.  Only take medicine as told by your doctor. ? You may get sick again if you do not take medicine as told. Finish your medicines, even if you start to feel better. ? Do not take aspirin.  Rest.  Rinse your mouth (gargle) with salt water ( tsp of salt per 1 qt of water) every 1-2 hours. This will help the pain.  If you are not at risk for choking, you can suck on hard candy or sore throat lozenges. Contact a doctor if:  You have large, tender lumps on your neck.  You have a rash.  You cough up Dentinger, yellow-brown, or bloody spit. Get help right away if:  You have a stiff neck.  You drool or cannot swallow liquids.  You throw up (vomit) or are not able to keep medicine or liquids down.  You have very bad pain that does not go away with medicine.  You have problems breathing (not from a stuffy nose). This information is not intended to replace advice given to you by your health care provider. Make sure you discuss any questions you have with your health care provider. Document Released: 10/27/2007 Document Revised: 10/16/2015 Document Reviewed: 01/15/2013 Elsevier Interactive Patient Education  2017 [employer]  

## 2017-07-13 ENCOUNTER — Ambulatory Visit: Payer: Self-pay | Admitting: Adult Health

## 2017-07-13 LAB — CBC WITH DIFFERENTIAL/PLATELET
BASOS: 0 %
Basophils Absolute: 0 10*3/uL (ref 0.0–0.2)
EOS (ABSOLUTE): 0.1 10*3/uL (ref 0.0–0.4)
Eos: 1 %
Hematocrit: 43.6 % (ref 37.5–51.0)
Hemoglobin: 14.7 g/dL (ref 13.0–17.7)
Immature Grans (Abs): 0 10*3/uL (ref 0.0–0.1)
Immature Granulocytes: 0 %
Lymphocytes Absolute: 1.7 10*3/uL (ref 0.7–3.1)
Lymphs: 23 %
MCH: 28.7 pg (ref 26.6–33.0)
MCHC: 33.7 g/dL (ref 31.5–35.7)
MCV: 85 fL (ref 79–97)
MONOS ABS: 1.2 10*3/uL — AB (ref 0.1–0.9)
Monocytes: 16 %
NEUTROS ABS: 4.6 10*3/uL (ref 1.4–7.0)
Neutrophils: 60 %
PLATELETS: 233 10*3/uL (ref 150–379)
RBC: 5.12 x10E6/uL (ref 4.14–5.80)
RDW: 13.4 % (ref 12.3–15.4)
WBC: 7.7 10*3/uL (ref 3.4–10.8)

## 2017-07-13 LAB — HIV ANTIBODY (ROUTINE TESTING W REFLEX): HIV Screen 4th Generation wRfx: NONREACTIVE

## 2017-07-13 NOTE — Progress Notes (Signed)
Please review labs with patient when he comes in today for his follow up . We will discuss patient at shift change today. TY

## 2017-07-14 ENCOUNTER — Ambulatory Visit: Payer: Self-pay | Admitting: Adult Health

## 2017-07-14 ENCOUNTER — Encounter: Payer: Self-pay | Admitting: Adult Health

## 2017-07-14 VITALS — BP 113/72 | HR 72 | Temp 98.3°F | Resp 16 | Ht 77.0 in | Wt 251.0 lb

## 2017-07-14 DIAGNOSIS — J029 Acute pharyngitis, unspecified: Secondary | ICD-10-CM

## 2017-07-14 MED ORDER — PREDNISONE 10 MG (21) PO TBPK: ORAL_TABLET | ORAL | 0 refills | Status: AC

## 2017-07-14 NOTE — Patient Instructions (Signed)
Prednisone tablets What is this medicine? PREDNISONE (PRED ni sone) is a corticosteroid. It is commonly used to treat inflammation of the skin, joints, lungs, and other organs. Common conditions treated include asthma, allergies, and arthritis. It is also used for other conditions, such as blood disorders and diseases of the adrenal glands. This medicine may be used for other purposes; ask your health care provider or pharmacist if you have questions. COMMON BRAND NAME(S): Deltasone, Predone, Sterapred, Sterapred DS What should I tell my health care provider before I take this medicine? They need to know if you have any of these conditions: -Cushing's syndrome -diabetes -glaucoma -heart disease -high blood pressure -infection (especially a virus infection such as chickenpox, cold sores, or herpes) -kidney disease -liver disease -mental illness -myasthenia gravis -osteoporosis -seizures -stomach or intestine problems -thyroid disease -an unusual or allergic reaction to lactose, prednisone, other medicines, foods, dyes, or preservatives -pregnant or trying to get pregnant -breast-feeding How should I use this medicine? Take this medicine by mouth with a glass of water. Follow the directions on the prescription label. Take this medicine with food. If you are taking this medicine once a day, take it in the morning. Do not take more medicine than you are told to take. Do not suddenly stop taking your medicine because you may develop a severe reaction. Your doctor will tell you how much medicine to take. If your doctor wants you to stop the medicine, the dose may be slowly lowered over time to avoid any side effects. Talk to your pediatrician regarding the use of this medicine in children. Special care may be needed. Overdosage: If you think you have taken too much of this medicine contact a poison control center or emergency room at once. NOTE: This medicine is only for you. Do not share this  medicine with others. What if I miss a dose? If you miss a dose, take it as soon as you can. If it is almost time for your next dose, talk to your doctor or health care professional. You may need to miss a dose or take an extra dose. Do not take double or extra doses without advice. What may interact with this medicine? Do not take this medicine with any of the following medications: -metyrapone -mifepristone This medicine may also interact with the following medications: -aminoglutethimide -amphotericin B -aspirin and aspirin-like medicines -barbiturates -certain medicines for diabetes, like glipizide or glyburide -cholestyramine -cholinesterase inhibitors -cyclosporine -digoxin -diuretics -ephedrine -male hormones, like estrogens and birth control pills -isoniazid -ketoconazole -NSAIDS, medicines for pain and inflammation, like ibuprofen or naproxen -phenytoin -rifampin -toxoids -vaccines -warfarin This list may not describe all possible interactions. Give your health care provider a list of all the medicines, herbs, non-prescription drugs, or dietary supplements you use. Also tell them if you smoke, drink alcohol, or use illegal drugs. Some items may interact with your medicine. What should I watch for while using this medicine? Visit your doctor or health care professional for regular checks on your progress. If you are taking this medicine over a prolonged period, carry an identification card with your name and address, the type and dose of your medicine, and your doctor's name and address. This medicine may increase your risk of getting an infection. Tell your doctor or health care professional if you are around anyone with measles or chickenpox, or if you develop sores or blisters that do not heal properly. If you are going to have surgery, tell your doctor or health care professional that  you have taken this medicine within the last twelve months. Ask your doctor or health  care professional about your diet. You may need to lower the amount of salt you eat. This medicine may affect blood sugar levels. If you have diabetes, check with your doctor or health care professional before you change your diet or the dose of your diabetic medicine. What side effects may I notice from receiving this medicine? Side effects that you should report to your doctor or health care professional as soon as possible: -allergic reactions like skin rash, itching or hives, swelling of the face, lips, or tongue -changes in emotions or moods -changes in vision -depressed mood -eye pain -fever or chills, cough, sore throat, pain or difficulty passing urine -increased thirst -swelling of ankles, feet Side effects that usually do not require medical attention (report to your doctor or health care professional if they continue or are bothersome): -confusion, excitement, restlessness -headache -nausea, vomiting -skin problems, acne, thin and shiny skin -trouble sleeping -weight gain This list may not describe all possible side effects. Call your doctor for medical advice about side effects. You may report side effects to FDA at 1-800-FDA-1088. Where should I keep my medicine? Keep out of the reach of children. Store at room temperature between 15 and 30 degrees C (59 and 86 degrees F). Protect from light. Keep container tightly closed. Throw away any unused medicine after the expiration date. NOTE: This sheet is a summary. It may not cover all possible information. If you have questions about this medicine, talk to your doctor, pharmacist, or health care provider.  2018 Elsevier/Gold Standard (2010-12-24 10:57:14) Pharyngitis Pharyngitis is a sore throat (pharynx). There is redness, pain, and swelling of your throat. Follow these instructions at home:  Drink enough fluids to keep your pee (urine) clear or pale yellow.  Only take medicine as told by your doctor. ? You may get sick  again if you do not take medicine as told. Finish your medicines, even if you start to feel better. ? Do not take aspirin.  Rest.  Rinse your mouth (gargle) with salt water ( tsp of salt per 1 qt of water) every 1-2 hours. This will help the pain.  If you are not at risk for choking, you can suck on hard candy or sore throat lozenges. Contact a doctor if:  You have large, tender lumps on your neck.  You have a rash.  You cough up Cowher, yellow-brown, or bloody spit. Get help right away if:  You have a stiff neck.  You drool or cannot swallow liquids.  You throw up (vomit) or are not able to keep medicine or liquids down.  You have very bad pain that does not go away with medicine.  You have problems breathing (not from a stuffy nose). This information is not intended to replace advice given to you by your health care provider. Make sure you discuss any questions you have with your health care provider. Document Released: 10/27/2007 Document Revised: 10/16/2015 Document Reviewed: 01/15/2013 Elsevier Interactive Patient Education  2017 [employer]

## 2017-07-14 NOTE — Progress Notes (Signed)
Subjective:     Patient ID: Alexander BusmanJoseph Clayton Schwartz, male   DOB: [birth year], 37 y.o.   MRN: [id number]  HPI   Patient is a 37 year old male in no acute distress who returns to the clinic for a recheck from 07/12/16. He was diagnosed with Pharyngitis, he reports since his last visit he is much improved. He reports he has had much less difficulty swallowing and decreased painful swallowing.  He got rid of his toothbrush yesterday.   Patient  denies any fever, chills, rash, chest pain, shortness of breath, nausea, vomiting, or diarrhea.  Blood pressure 113/72, pulse 72, temperature 98.3 F (36.8 C), resp. rate 16, height 6\' 5"  (1.956 m), weight 251 lb (113.9 kg), SpO2 98 %.   Reviewed labs as well from previous visit.    Review of Systems  Constitutional: Negative.   HENT: Positive for sore throat (improving ). Negative for congestion, dental problem, drooling, ear discharge, ear pain, facial swelling, hearing loss, mouth sores, nosebleeds, postnasal drip, rhinorrhea, sinus pressure, sinus pain, sneezing, tinnitus, trouble swallowing (much imp(roved from previous visit able to eat and drink liquids ) and voice change.   Eyes: Negative.   Respiratory: Negative.   Cardiovascular: Negative.   Gastrointestinal: Negative.   Endocrine: Negative.   Genitourinary: Negative.   Musculoskeletal: Negative.   Allergic/Immunologic:       No Known Allergies   Neurological: Negative.   Hematological: Negative.   Psychiatric/Behavioral: Negative.        Objective:   Physical Exam  Constitutional: He is oriented to person, place, and time. Vital signs are normal. He appears well-developed and well-nourished. He is active.  Non-toxic appearance. He does not have a sickly appearance. He does not appear ill. No distress.  HENT:  Head: Normocephalic and atraumatic.  Right Ear: Hearing, tympanic membrane, external ear and ear canal normal.  Left Ear: Hearing, tympanic membrane, external ear and ear canal  normal.  Nose: Nose normal. No mucosal edema or rhinorrhea. Right sinus exhibits no maxillary sinus tenderness and no frontal sinus tenderness. Left sinus exhibits no maxillary sinus tenderness and no frontal sinus tenderness.  Mouth/Throat: Uvula is midline and mucous membranes are normal. Mucous membranes are not pale, not dry and not cyanotic. No uvula swelling. Posterior oropharyngeal erythema present. No oropharyngeal exudate, posterior oropharyngeal edema or tonsillar abscesses.  1 + bilateral tonsils without exudate or pus.  No abscess visualized.  Normal phonation.   Eyes: Conjunctivae and EOM are normal. Pupils are equal, round, and reactive to light. Right eye exhibits no discharge. Left eye exhibits no discharge. No scleral icterus.  Neck: Normal range of motion. Neck supple. No JVD present. No tracheal deviation present.  Cardiovascular: Normal rate, regular rhythm, normal heart sounds and intact distal pulses. Exam reveals no gallop and no friction rub.  No murmur heard. Pulmonary/Chest: Effort normal and breath sounds normal. No stridor. No respiratory distress. He has no wheezes. He has no rales. He exhibits no tenderness.  Abdominal: Soft. Bowel sounds are normal.  Musculoskeletal: Normal range of motion.  Lymphadenopathy:       Head (right side): No submental, no submandibular, no tonsillar, no preauricular, no posterior auricular and no occipital adenopathy present.       Head (left side): No submental, no submandibular, no tonsillar, no preauricular, no posterior auricular and no occipital adenopathy present.    He has no cervical adenopathy.  Neurological: He is alert and oriented to person, place, and time. He displays normal reflexes. No  cranial nerve deficit. He exhibits normal muscle tone. Coordination normal.  Patient moves on and off of exam table and in room without difficulty. Gait is normal in hall and in room. Patient is oriented to person place time and situation.  Patient answers questions appropriately and engages in conversation.   Skin: Skin is warm, dry and intact. No rash noted. He is not diaphoretic. No erythema. No pallor.  History eczema   Psychiatric: He has a normal mood and affect. His speech is normal and behavior is normal. Judgment and thought content normal. Cognition and memory are normal.  Vitals reviewed.      Assessment:        Pharyngitis, unspecified etiology   Plan:     Discussed and educated on Non Reactive HIV test as well as CBC and follow as needed/education given.  Continue Antibiotics as prescribed.    Will add :  Meds ordered this encounter  Medications  . predniSONE (STERAPRED UNI-PAK 21 TAB) 10 MG (21) TBPK tablet    Sig: By mouth Take 6 tablets on day 1, Take 5 tablets day 2 Take 4 tablets day 3 Take 3 tablets day 4 Take 2 tablets day five 5 Take 1 tablet day    Dispense:  21 tablet    Refill:  0   Return to the office on Friday 07/15/17 if any symptoms worsen at all call for an appointment to be seen that day.   If after hours and worsens seek medical care at Urgent care  or Emergency room. If any emergent symptoms call 911.   Advised to return to clinic for an appointment if no improvement within 72 hours or if any symptoms change or worsen. Advised ER or urgent Care if after hours or on weekend. 911 for emergency symptoms at any time.   Patient verbalized understanding of all instructions given and denies any further questions at this time.

## 2018-07-27 ENCOUNTER — Encounter (INDEPENDENT_AMBULATORY_CARE_PROVIDER_SITE_OTHER): Payer: Self-pay | Admitting: Family

## 2018-07-27 ENCOUNTER — Ambulatory Visit (INDEPENDENT_AMBULATORY_CARE_PROVIDER_SITE_OTHER): Payer: BC Managed Care – PPO | Admitting: Family

## 2018-07-27 VITALS — BP 110/69 | HR 71 | Temp 98.0°F | Resp 16 | Ht 77.0 in | Wt 253.0 lb

## 2018-07-27 DIAGNOSIS — J029 Acute pharyngitis, unspecified: Secondary | ICD-10-CM

## 2018-07-27 LAB — POCT RAPID STREP A: Rapid Strep A Screen POCT: NEGATIVE

## 2018-07-27 MED ORDER — AMOXICILLIN-POT CLAVULANATE 875-125 MG PO TABS
1.00 | ORAL_TABLET | Freq: Two times a day (BID) | ORAL | 0 refills | Status: AC
Start: 2018-07-27 — End: 2018-08-06

## 2018-07-27 NOTE — Patient Instructions (Addendum)
Drink plenty of fluids  Get plenty of rest.  Take Motrin or Tylenol as needed for pain/fever  Change toothbrush after 72 hours on antibiotic  Salt water gargles 2 to 3 times daily.  (1/2 teaspoon salt with 1 cup warm water).  Contagious until on antibiotic for 12 to 24  Hours.  I recommend taking a probiotic while taking antibiotic.  Please complete course finish all antibiotics even if you are feeling better.  If you feel you are having an adverse reaction to the medication please let Urgent Care or you primary care know as soon as possible  Follow up with PCP if symptoms do not improve in 72 hours.  If you develop severe symptoms such as significant neck pain, unable to swallow, drooling, unable to keep fluids down then please go to the Emergency Department.   Return to Urgent Care as needed.          Pharyngitis: Strep (Presumed)    You have pharyngitis (sore throat). The healthcare staff think your sore throat is caused by streptococcus (strep) bacteria. This is often called strep throat. Strep throat can cause throat pain that is worse when swallowing, aching all over, headache, and fever. The infection is contagious. It may be spread by coughing, kissing, or touching others after touching your mouth or nose. Antibiotic medicine is given to treat the infection.  Home care  · Rest at home. Drink plenty of fluids so you won’t get dehydrated.  · Stay home from work or school for the first 2 days of taking the antibiotics. After this time, you will not be contagious. You can then return to work or school if you are feeling better.   · Take the antibiotic medicine for the full 10 days, even when you feel better. This is very important to make sure the infection is fully treated. It is also important to prevent medicine-resistant germs from growing. If you were given an antibiotic shot, no more antibiotics are needed.  · You may use acetaminophen or ibuprofen to control pain or fever, unless another medicine was  prescribed for this. If you have chronic liver or kidney disease or ever had a stomach ulcer or GI bleeding, talk with your healthcare provider before using these medicines.  · Use throat lozenges or a throat-numbing spray to help reduce throat pain. Gargling with warm salt water can also help reduce throat pain. Dissolve 1/2 teaspoon of salt in 1 glass of warm water.   · Don’t eat salty or spicy foods. These can irritate the throat.  Follow-up care  Follow up with your healthcare provider or our staff if you don't get better over the next week.  When to seek medical advice  Call your healthcare provider right away if any of these occur:  · Fever as directed by your healthcare provider  · New or worse ear pain, sinus pain, or headache  · Painful lumps in the back of neck  · Stiff neck  · Lymph nodes that get larger  · Can’t swallow liquids, a lot of drooling, or can’t open mouth wide due to throat pain  · Signs of dehydration, such as very dark urine or no urine, sunken eyes, dizziness  · Trouble breathing or noisy breathing  · Muffled voice  · New rash  Prevention  Here are steps you can take to help prevent an infection:  · Keep good hand washing habits.  · Don’t have close contact with people who have sore throats, colds, or other upper respiratory infections.  · Don’t   smoke, and stay away from secondhand smoke.  · Stay up to date with of your vaccines.  StayWell last reviewed this educational content on 03/24/2016  © 2000-2020 [employer] [address], [place] [address]. All rights reserved. This information is not intended as a substitute for professional medical care. Always follow your healthcare professional's instructions.

## 2018-07-27 NOTE — Progress Notes (Signed)
[facility]  PROGRESS NOTE     Patient: Patrick Freeman   Date: 07/27/2018   MRN: [id number]       DESTEN MANOR is a 38 y.o. male      HISTORY     Chief Complaint   Patient presents with    Sore Throat     slightly sore and has a history of strep 14 x this past 14 months. OTC none.        38 YO M with PMH of Asthma and hypothyroid that presents with sore throat x 1 day. Patient has a history of recurrent strep throat and endorses positive testing 14 times over the pat 14 months. Most recent positive in January and was treated with Augmentin at that time. + pain in throat, congestion, and scratchy throat. Denies N/V/D, chest pain,SOB, headache, body ache, fevers, chills, ear pain, sinus pain/presssure, or recent URI symptoms.       History provided by:  Patient  Language interpreter used: No    Sore Throat    This is a chronic problem. The current episode started yesterday. The problem has been gradually worsening. Neither side of throat is experiencing more pain than the other. There has been no fever. The pain is at a severity of 0/10. The patient is experiencing no pain. Associated symptoms include congestion. Pertinent negatives include no abdominal pain, coughing, diarrhea, drooling, ear discharge, ear pain, headaches, hoarse voice, plugged ear sensation, neck pain, shortness of breath, stridor, swollen glands, trouble swallowing or vomiting. He has had exposure to strep. He has tried nothing for the symptoms. The treatment provided no relief.       Review of Systems   Constitutional: Negative for appetite change, chills, diaphoresis, fatigue and fever.   HENT: Positive for congestion and sore throat. Negative for drooling, ear discharge, ear pain, hearing loss, hoarse voice, postnasal drip, rhinorrhea, sinus pressure, sinus pain, sneezing, tinnitus, trouble swallowing and voice change.    Eyes: Negative for pain, discharge and itching.   Respiratory: Negative for cough, chest tightness, shortness of  breath, wheezing and stridor.    Cardiovascular: Negative for chest pain, palpitations and leg swelling.   Gastrointestinal: Negative for abdominal pain, diarrhea, nausea and vomiting.   Musculoskeletal: Negative for arthralgias, myalgias, neck pain and neck stiffness.   Skin: Negative for color change, pallor and rash.   Allergic/Immunologic: Negative for environmental allergies.   Neurological: Negative for dizziness, facial asymmetry, weakness and headaches.   Hematological: Positive for adenopathy.   Psychiatric/Behavioral: Negative for confusion and sleep disturbance.       History:  Past Medical History:   Diagnosis Date    Asthma     Hypothyroid        Past Surgical History:   Procedure Laterality Date    BACK SURGERY  08/2011    Herniated Disc S1-L5       Family History   Problem Relation Age of Onset    Cancer Mother     Cancer Father        Social History     Tobacco Use    Smoking status: Never Smoker    Smokeless tobacco: Never Used   Substance Use Topics    Alcohol use: No    Drug use: No       History reviewed.        Current Outpatient Medications:     Ciclesonide 37 MCG/ACT Aero Soln, by Nasal route., Disp: , Rfl:  fluticasone-salmeterol (ADVAIR DISKUS) 250-50 MCG/DOSE Aerosol Powder, Breath Activtivatede, Inhale 1 puff into the lungs., Disp: , Rfl:     montelukast (SINGULAIR) 10 MG tablet, Take 10 mg by mouth nightly., Disp: , Rfl:     amoxicillin-clavulanate (AUGMENTIN) 875-125 MG per tablet, Take 1 tablet by mouth 2 (two) times daily for 10 days, Disp: 20 tablet, Rfl: 0    levothyroxine (SYNTHROID, LEVOTHROID) 25 MCG tablet, Take 50 mcg by mouth Once a day at 6:00am., Disp: , Rfl:     No Known Allergies    Medications and Allergies reviewed.    PHYSICAL EXAM     Vitals:    07/27/18 0818   BP: 110/69   BP Site: Right arm   Patient Position: Sitting   Cuff Size: Large   Pulse: 71   Resp: 16   Temp: 98 F (36.7 C)   TempSrc: Tympanic   SpO2: 97%   Weight: 114.8 kg (253 lb)   Height:  1.956 m (6\' 5" )       Physical Exam   Nursing note and vitals reviewed.  Constitutional: He is oriented to person, place, and time. He appears well-developed and well-nourished. No distress.   HENT:   Head: Normocephalic and atraumatic.   Right Ear: Tympanic membrane, external ear and ear canal normal.   Left Ear: Tympanic membrane, external ear and ear canal normal.   Nose: Nose normal. Right sinus exhibits no maxillary sinus tenderness and no frontal sinus tenderness. Left sinus exhibits no maxillary sinus tenderness and no frontal sinus tenderness.   Mouth/Throat: Uvula is midline and mucous membranes are normal. Posterior oropharyngeal edema and posterior oropharyngeal erythema present. No oropharyngeal exudate or tonsillar abscesses.       Eyes: Conjunctivae and EOM are normal. Pupils are equal, round, and reactive to light. Right eye exhibits no discharge. Left eye exhibits no discharge. Right conjunctiva is not injected. Left conjunctiva is not injected. No scleral icterus.   Neck: Normal range of motion. Neck supple.   Cardiovascular: Normal rate, regular rhythm and normal heart sounds. Exam reveals no gallop and no friction rub.   No murmur heard.  Pulmonary/Chest: Effort normal and breath sounds normal. No respiratory distress. He has no decreased breath sounds. He has no wheezes. He has no rhonchi. He has no rales. He exhibits no tenderness.   Musculoskeletal: Normal range of motion.         General: No tenderness or edema.   Lymphadenopathy:        Head (right side): No submental and no submandibular adenopathy present.        Head (left side): No submental and no submandibular adenopathy present.     He has no cervical adenopathy.   Neurological: He is alert and oriented to person, place, and time.   Skin: Skin is warm and dry. No rash noted. He is not diaphoretic. No erythema. No pallor.   Psychiatric: He has a normal mood and affect.         [facility] COURSE       Results     Procedure Component Value Units  Date/Time    Rapid Group A Strep [[id number]]  (Normal) Collected:  07/27/18 0831    Specimen:  Throat Updated:  07/27/18 0831     POCT QC Pass     Rapid Strep A Screen POCT Negative      Comment Negative Results should be confirmed by throat Cx to confirm absence of Strep A inf.  No results found.      Orders Placed This Encounter   Medications    amoxicillin-clavulanate (AUGMENTIN) 875-125 MG per tablet     Sig: Take 1 tablet by mouth 2 (two) times daily for 10 days     Dispense:  20 tablet     Refill:  0         PROCEDURES     Procedures       ASSESSMENT     Encounter Diagnosis   Name Primary?    Sore throat Yes          SSESSMENT    PLAN     Pt has been alternating between clindamycin and augmentin at his ENT at [facility].  He says this feels exactly like strep and req starting treatment despite negative RST.  Pt does have clinical signs of strep with positive Centor score.  Will treat and send throat culture.     Salt water gargles   Motrin/tylenol as needed   Change toothbrush in 72 hours   F/u with ENT- pt has appointment April 29th       Discussed results and diagnosis with patient/family.  Reviewed warning signs for worsening condition, as well as, indications for follow-up with pmd and return to urgent care clinic.   Patient/family expressed understanding of instructions.    Orders Placed This Encounter   Procedures    Throat Culture ([place])    Rapid Group A Strep         An After Visit Summary was printed and given to the patient.      Signed,  Rometta Emery, FNP

## 2019-02-22 IMAGING — CT CT ABD-PELV W/ CM
2 of 4 series · 15 of 46 positions shown, 17 images · IV contrast (APPLIED)
Comparison: CT of the abdomen and pelvis from 01/18/2017, and
abdominal ultrasound performed 01/21/2017

CLINICAL DATA: Acute onset of mid abdominal pain. Nausea, vomiting
and diarrhea. Initial encounter.

EXAM:
CT ABDOMEN AND PELVIS WITH CONTRAST
TECHNIQUE: Multidetector CT imaging of the abdomen and pelvis was performed
using the standard protocol following bolus administration of
intravenous contrast.
CONTRAST:  100mL [id number] IOPAMIDOL ([id number]) INJECTION 61%

[Series 2: routine abd/pel with · axial · 0.85mm/px · z∈[-1084,-584]mm · 12 of 115 slices shown, 14 images]
[im 10/115  soft-tissue]
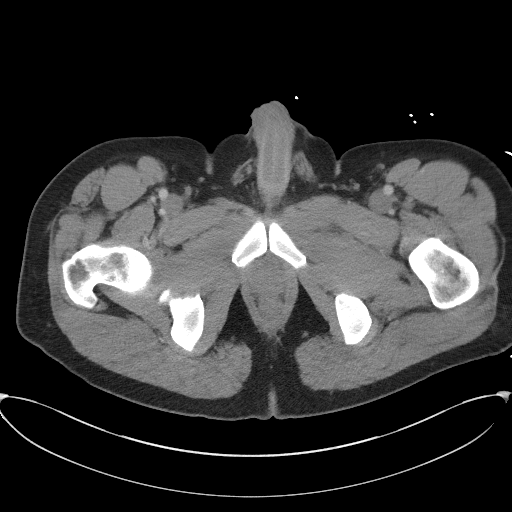
[im 10/115  bone]
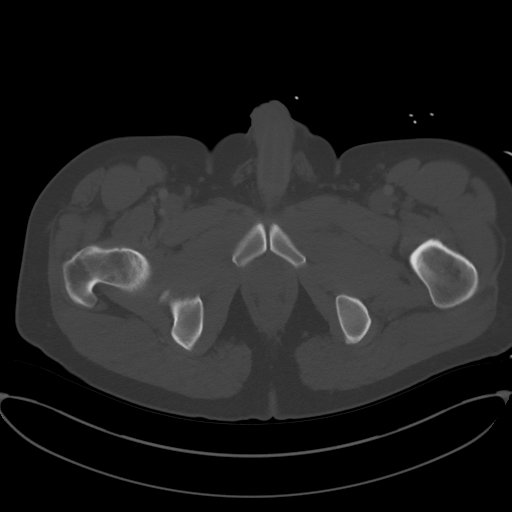
[im 19/115  soft-tissue]
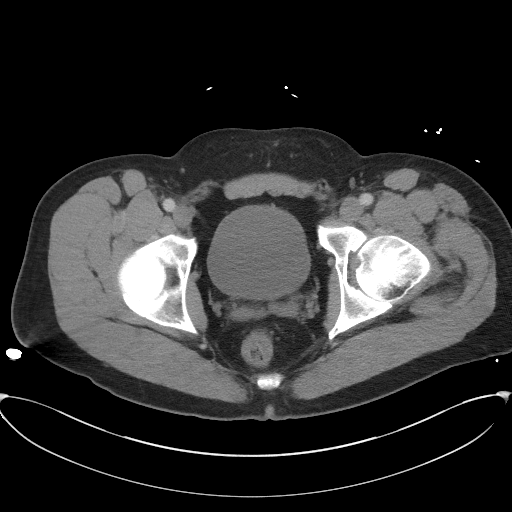
[im 28/115  soft-tissue]
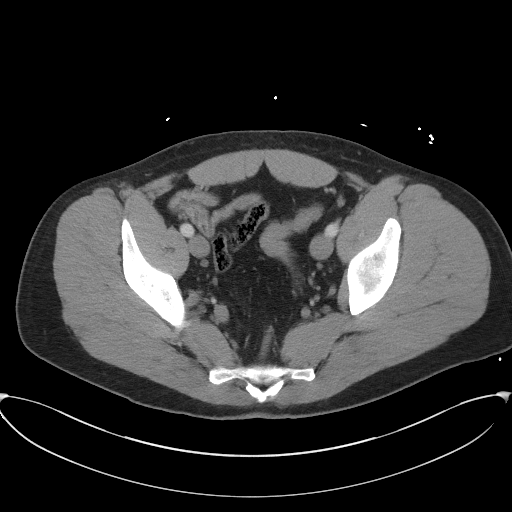
[im 37/115  soft-tissue]
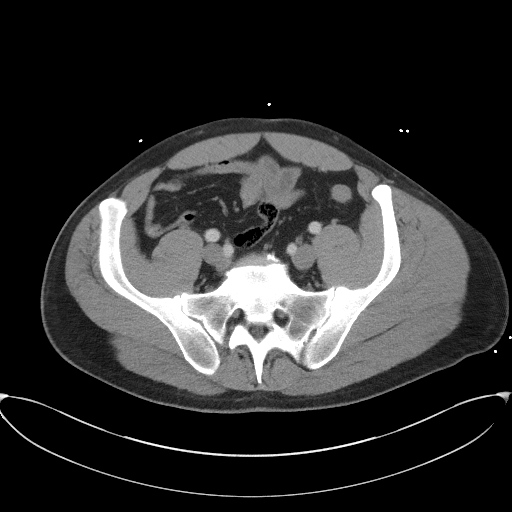
[im 46/115  soft-tissue]
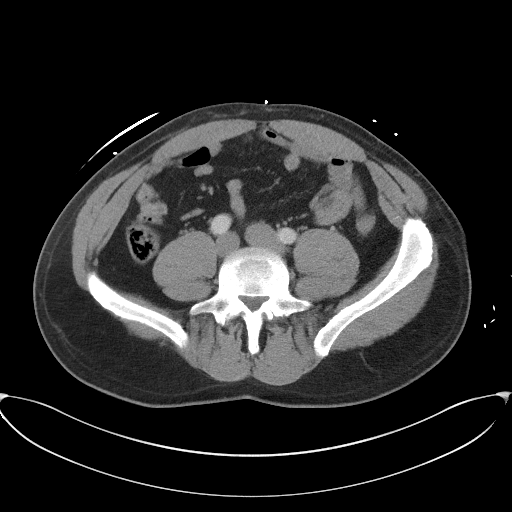
[im 55/115  soft-tissue]
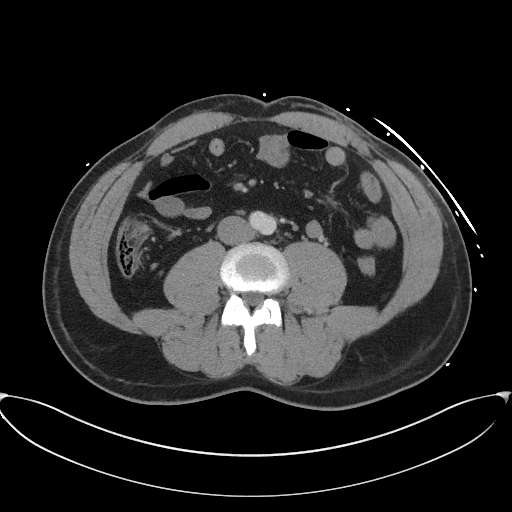
[im 64/115  soft-tissue]
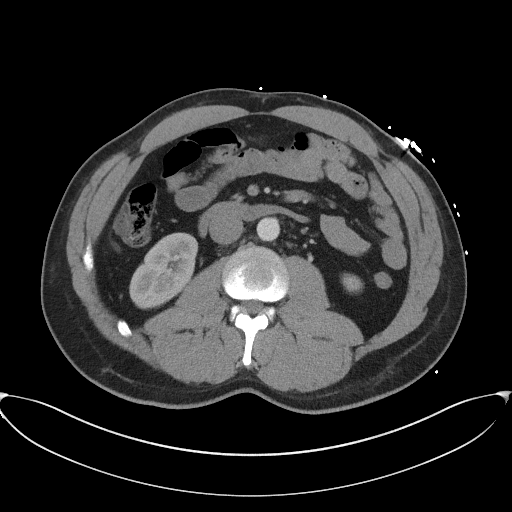
[im 73/115  soft-tissue]
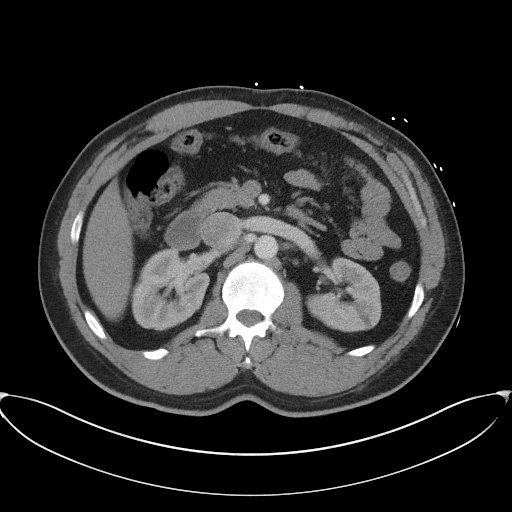
[im 83/115  soft-tissue]
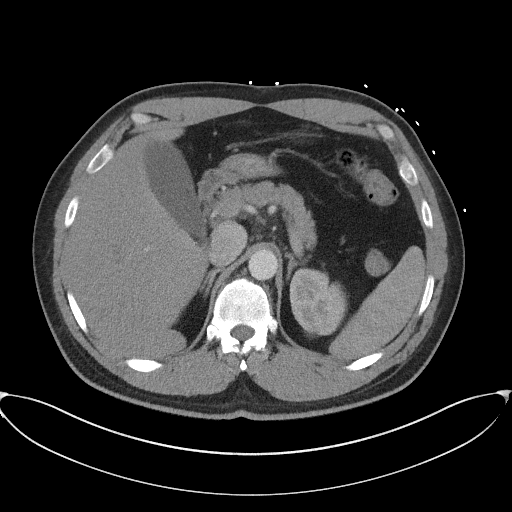
[im 83/115  bone]
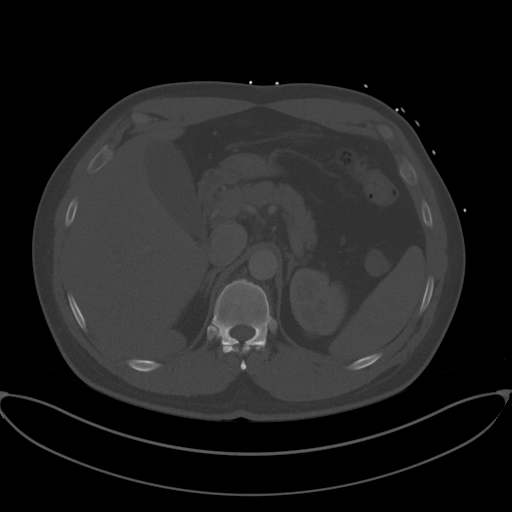
[im 92/115  soft-tissue]
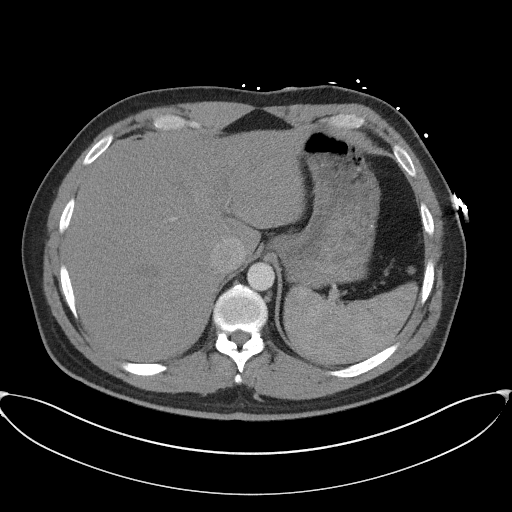
[im 101/115  soft-tissue]
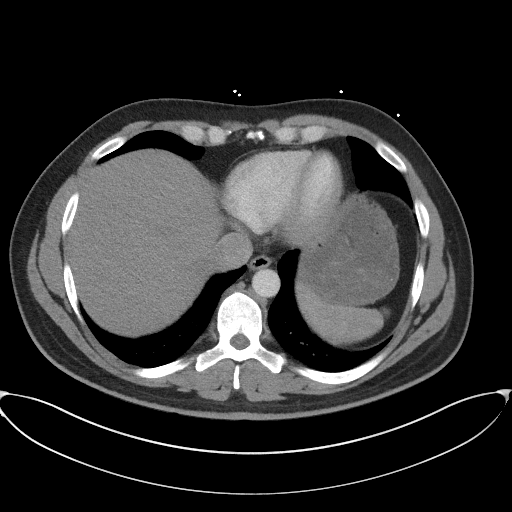
[im 110/115  soft-tissue]
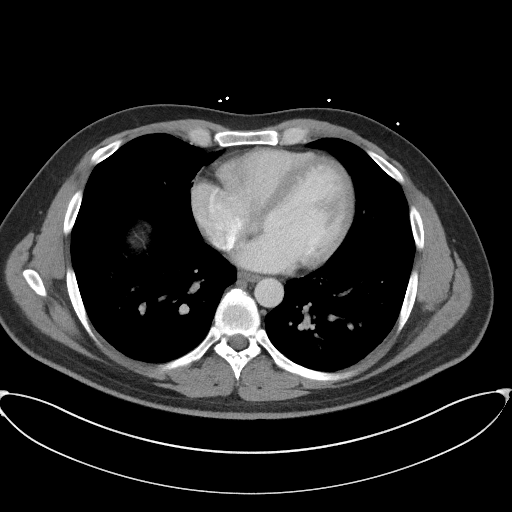

[Series 5: coronal st · coronal · 0.79mm/px · 3 of 96 slices shown]
[im 32/96  soft-tissue]
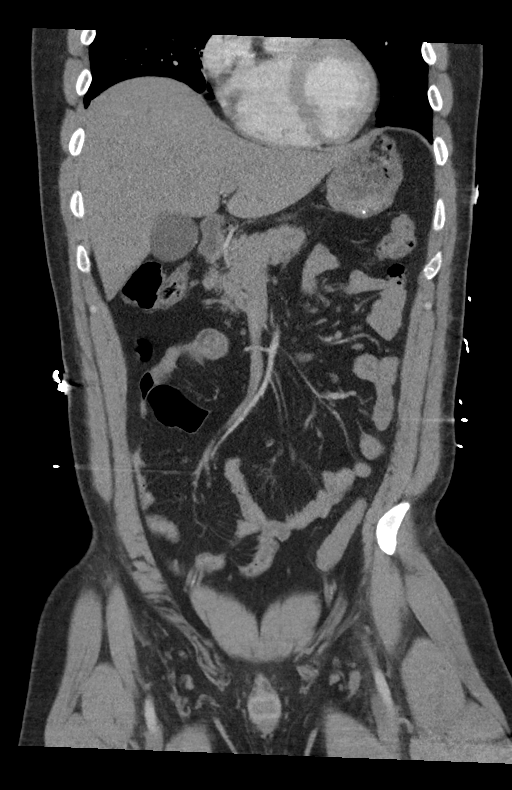
[im 43/96  soft-tissue]
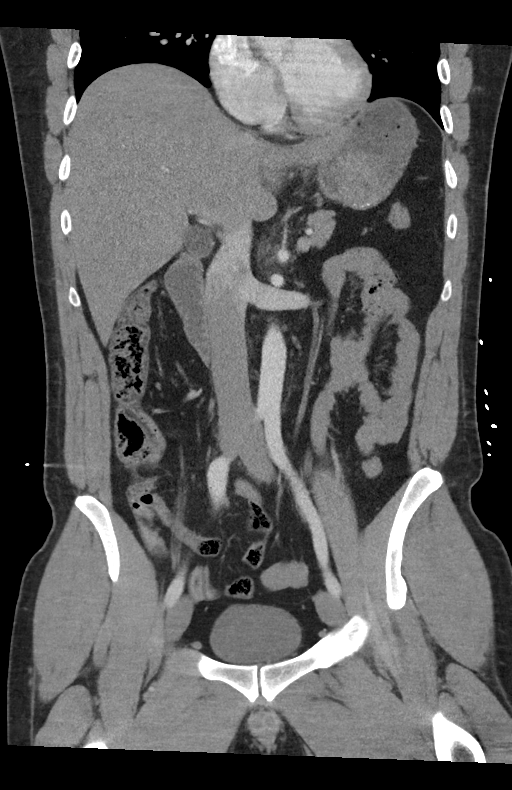
[im 53/96  soft-tissue]
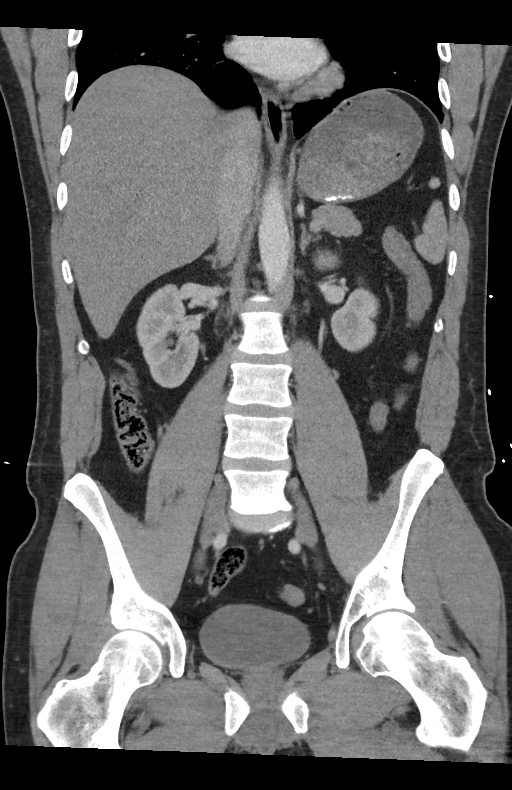

[15 of 46 positions shown; findings below may reference images not displayed]

FINDINGS: Lower chest: Mild bibasilar atelectasis is noted. The visualized
portions of the mediastinum are unremarkable.

Hepatobiliary: The liver is unremarkable in appearance. The
gallbladder is unremarkable in appearance. The common bile duct
remains normal in caliber.

Pancreas: The pancreas is within normal limits.

Spleen: The spleen is unremarkable in appearance.

Adrenals/Urinary Tract: The adrenal glands are unremarkable in
appearance.

A small left renal cyst is noted. The kidneys are otherwise
unremarkable. There is no evidence of hydronephrosis. No renal or
ureteral stones are identified.

Stomach/Bowel: The stomach is unremarkable in appearance. The small
bowel is within normal limits. The appendix is normal in caliber,
without evidence of appendicitis. The colon is unremarkable in
appearance.

Vascular/Lymphatic: The abdominal aorta is unremarkable in
appearance. The inferior vena cava is grossly unremarkable. No
retroperitoneal lymphadenopathy is seen. No pelvic sidewall
lymphadenopathy is identified.

Reproductive: The bladder is mildly distended and grossly
unremarkable. The prostate remains normal in size.

Other: Mild nonspecific haziness is noted about the celiac trunk, of
uncertain significance.

Musculoskeletal: No acute osseous abnormalities are identified.
Vacuum phenomenon and endplate sclerotic change are noted at L5-S1.
The visualized musculature is unremarkable in appearance.
IMPRESSION: 1. Mild nonspecific haziness noted about the celiac trunk, perhaps
slightly more prominent than on the prior study, of uncertain
significance.
2. Small left renal cyst.
3. Mild bibasilar atelectasis noted.

## 2019-06-06 ENCOUNTER — Other Ambulatory Visit: Payer: Self-pay

## 2019-06-06 ENCOUNTER — Ambulatory Visit (HOSPITAL_COMMUNITY)
Admission: EM | Admit: 2019-06-06 | Discharge: 2019-06-06 | Disposition: A | Discharge status: Home / Self Care | Payer: 59 | Attending: Family Medicine | Admitting: Family Medicine

## 2019-06-06 ENCOUNTER — Encounter (HOSPITAL_COMMUNITY): Payer: Self-pay

## 2019-06-06 DIAGNOSIS — R369 Urethral discharge, unspecified: Secondary | ICD-10-CM | POA: Diagnosis not present

## 2019-06-06 DIAGNOSIS — Z711 Person with feared health complaint in whom no diagnosis is made: Secondary | ICD-10-CM | POA: Insufficient documentation

## 2019-06-06 LAB — POCT URINALYSIS DIP (DEVICE)
Bilirubin Urine: NEGATIVE
Glucose, UA: NEGATIVE mg/dL
Ketones, ur: NEGATIVE mg/dL
Leukocytes,Ua: NEGATIVE
Nitrite: NEGATIVE
Protein, ur: NEGATIVE mg/dL
Specific Gravity, Urine: >=1.03 (ref 1.005–1.030)
Urobilinogen, UA: 0.2 mg/dL (ref 0.0–1.0)
pH: 6 (ref 5.0–8.0)

## 2019-06-06 MED ORDER — DOXYCYCLINE HYCLATE 100 MG PO CAPS: 100.0000 mg | ORAL_CAPSULE | Freq: Two times a day (BID) | ORAL | 0 refills | Status: AC

## 2019-06-06 MED ORDER — CEFTRIAXONE SODIUM 500 MG IJ SOLR
INTRAMUSCULAR | Status: AC
  Filled 2019-06-06: qty 500

## 2019-06-06 MED ORDER — CEFTRIAXONE SODIUM 500 MG IJ SOLR
500.0000 mg | Freq: Once | INTRAMUSCULAR | Status: AC
  Administered 2019-06-06: 500 mg via INTRAMUSCULAR

## 2019-06-06 NOTE — Discharge Instructions (Signed)

## 2019-06-06 NOTE — ED Triage Notes (Signed)
Patient presents to Urgent Care with complaints of burning w/ urination, penile discharge, and left testicle swelling since three days ago. Patient reports is concerned for STDs or UTI.

## 2019-06-07 LAB — CYTOLOGY, (ORAL, ANAL, URETHRAL) ANCILLARY ONLY
Chlamydia: NEGATIVE
Neisseria Gonorrhea: NEGATIVE
Trichomonas: NEGATIVE

## 2019-06-09 NOTE — ED Provider Notes (Signed)
[facility]   [id number] 06/06/19 Arrival Time: 1237  ASSESSMENT & PLAN:  1. Penile discharge   2. Concern about STD in male without diagnosis     Normal scrotal/testicular exam. No s/s of torsion.   Discharge Instructions     You have been given the following today for treatment of suspected gonorrhea and/or chlamydia:  cefTRIAXone (ROCEPHIN) injection 500 mg  Please pick up your prescription for doxycycline 100 mg and begin taking twice daily for the next seven (7) days.  Even though we have treated you today, we have sent testing for sexually transmitted infections. We will notify you of any positive results once they are received. If required, we will prescribe any medications you might need.  Please refrain from all sexual activity for at least the next seven days.     Pending: Labs Reviewed  POCT URINALYSIS DIP (DEVICE) - Abnormal; Notable for the following components:      Result Value   Hgb urine dipstick TRACE (*)    All other components within normal limits  CYTOLOGY, (ORAL, ANAL, URETHRAL) ANCILLARY ONLY    Will notify of any positive results. Instructed to refrain from sexual activity for at least seven days.  Reviewed expectations re: course of current medical issues. Questions answered. Outlined signs and symptoms indicating need for more acute intervention. Patient verbalized understanding. After Visit Summary given.   SUBJECTIVE:  Brailon Schwartz is a 39 y.o. male who presents with complaint of penile discharge and left testicle discomfort without swelling. Onset gradual. First noticed 2-3 d ago. Describes discharge as thick and opaque but not a lot. No specific aggravating or alleviating factors reported. Denies: urinary frequency and dysuria. Afebrile. No abdominal or pelvic pain. No n/v. No rashes or lesions. Reports that he is sexually active with single male partner. OTC treatment: none. History of STI: none reported.  ROS: As  per HPI. All other systems negative.   OBJECTIVE:  Vitals:   06/06/19 1312  BP: 119/82  Pulse: 72  Resp: 15  Temp: 97.9 F (36.6 C)  TempSrc: Oral  SpO2: 100%     General appearance: alert, cooperative, appears stated age and no distress Throat: lips, mucosa, and tongue normal; teeth and gums normal CV: RRR Lungs: CTAB Back: no CVA tenderness; FROM at waist Abdomen: soft, non-tender GU: normal appearing genitalia; normal testicular exam Skin: warm and dry Psychological: alert and cooperative; normal mood and affect.  Results for orders placed or performed during the hospital encounter of 06/06/19  POCT urinalysis dip (device)  Result Value Ref Range   Glucose, UA NEGATIVE NEGATIVE mg/dL   Bilirubin Urine NEGATIVE NEGATIVE   Ketones, ur NEGATIVE NEGATIVE mg/dL   Specific Gravity, Urine >=1.030 1.005 - 1.030   Hgb urine dipstick TRACE (A) NEGATIVE   pH 6.0 5.0 - 8.0   Protein, ur NEGATIVE NEGATIVE mg/dL   Urobilinogen, UA 0.2 0.0 - 1.0 mg/dL   Nitrite NEGATIVE NEGATIVE   Leukocytes,Ua NEGATIVE NEGATIVE  Cytology (oral, anal, urethral) ancillary only      No Known Allergies  Past Medical History:  Diagnosis Date  . Asthma    Family History  Problem Relation Age of Onset  . Breast cancer Mother   . Prostate cancer Father   . Colon cancer Maternal Grandmother    Social History   Socioeconomic History  . Marital status: Single    Spouse name: Not on file  . Number of children: Not on file  . Years of education:  Not on file  . Highest education level: Not on file  Occupational History  . Not on file  Tobacco Use  . Smoking status: Never Smoker  . Smokeless tobacco: Never Used  Substance and Sexual Activity  . Alcohol use: Yes    Alcohol/week: 4.0 standard drinks    Types: 4 Glasses of wine per week    Comment: occ  . Drug use: No  . Sexual activity: Not on file  Other Topics Concern  . Not on file  Social History Narrative  . Not on file    Social Determinants of Health   Financial Resource Strain:   . Difficulty of Paying Living Expenses: Not on file  Food Insecurity:   . Worried About Charity fundraiser in the Last Year: Not on file  . Ran Out of Food in the Last Year: Not on file  Transportation Needs:   . Lack of Transportation (Medical): Not on file  . Lack of Transportation (Non-Medical): Not on file  Physical Activity:   . Days of Exercise per Week: Not on file  . Minutes of Exercise per Session: Not on file  Stress:   . Feeling of Stress : Not on file  Social Connections:   . Frequency of Communication with Friends and Family: Not on file  . Frequency of Social Gatherings with Friends and Family: Not on file  . Attends Religious Services: Not on file  . Active Member of Clubs or Organizations: Not on file  . Attends Archivist Meetings: Not on file  . Marital Status: Not on file  Intimate Partner Violence:   . Fear of Current or Ex-Partner: Not on file  . Emotionally Abused: Not on file  . Physically Abused: Not on file  . Sexually Abused: Not on file          Vanessa Kick, MD 06/09/19 [id number]
